# Patient Record
Sex: Female | Born: 1945 | Race: White | Hispanic: No | Smoking: Former smoker
Health system: Southern US, Community
[De-identification: ages and names within clinical notes are randomized; demographics above are authoritative.]

## PROBLEM LIST (undated history)

## (undated) DIAGNOSIS — E7800 Pure hypercholesterolemia, unspecified: Secondary | ICD-10-CM

## (undated) DIAGNOSIS — K589 Irritable bowel syndrome without diarrhea: Secondary | ICD-10-CM

## (undated) DIAGNOSIS — I4891 Unspecified atrial fibrillation: Secondary | ICD-10-CM

## (undated) DIAGNOSIS — J449 Chronic obstructive pulmonary disease, unspecified: Secondary | ICD-10-CM

## (undated) DIAGNOSIS — E785 Hyperlipidemia, unspecified: Secondary | ICD-10-CM

## (undated) DIAGNOSIS — M1990 Unspecified osteoarthritis, unspecified site: Secondary | ICD-10-CM

## (undated) DIAGNOSIS — E079 Disorder of thyroid, unspecified: Secondary | ICD-10-CM

## (undated) DIAGNOSIS — K52832 Lymphocytic colitis: Secondary | ICD-10-CM

## (undated) DIAGNOSIS — J45909 Unspecified asthma, uncomplicated: Secondary | ICD-10-CM

## (undated) DIAGNOSIS — N289 Disorder of kidney and ureter, unspecified: Secondary | ICD-10-CM

## (undated) DIAGNOSIS — A0472 Enterocolitis due to Clostridium difficile, not specified as recurrent: Secondary | ICD-10-CM

## (undated) DIAGNOSIS — R011 Cardiac murmur, unspecified: Secondary | ICD-10-CM

## (undated) HISTORY — DX: Chronic obstructive pulmonary disease, unspecified: J44.9

## (undated) HISTORY — DX: Unspecified osteoarthritis, unspecified site: M19.90

## (undated) HISTORY — DX: Enterocolitis due to Clostridium difficile, not specified as recurrent: A04.72

## (undated) HISTORY — PX: TONSILLECTOMY: SUR1361

## (undated) HISTORY — DX: Lymphocytic colitis: K52.832

## (undated) HISTORY — DX: Irritable bowel syndrome, unspecified: K58.9

## (undated) HISTORY — DX: Pure hypercholesterolemia, unspecified: E78.00

## (undated) HISTORY — DX: Unspecified asthma, uncomplicated: J45.909

## (undated) HISTORY — DX: Unspecified atrial fibrillation: I48.91

---

## 1998-05-02 ENCOUNTER — Other Ambulatory Visit: Admission: RE | Admit: 1998-05-02 | Discharge: 1998-05-02 | Payer: Self-pay | Admitting: Obstetrics and Gynecology

## 1998-05-22 ENCOUNTER — Other Ambulatory Visit: Admission: RE | Admit: 1998-05-22 | Discharge: 1998-05-22 | Payer: Self-pay | Admitting: Obstetrics and Gynecology

## 1998-09-03 ENCOUNTER — Other Ambulatory Visit: Admission: RE | Admit: 1998-09-03 | Discharge: 1998-09-03 | Payer: Self-pay | Admitting: Obstetrics and Gynecology

## 1999-02-03 ENCOUNTER — Other Ambulatory Visit: Admission: RE | Admit: 1999-02-03 | Discharge: 1999-02-03 | Payer: Self-pay | Admitting: Obstetrics & Gynecology

## 1999-08-11 ENCOUNTER — Encounter: Admission: RE | Admit: 1999-08-11 | Discharge: 1999-08-11 | Payer: Self-pay | Admitting: Pulmonary Disease

## 1999-08-11 ENCOUNTER — Encounter: Payer: Self-pay | Admitting: Pulmonary Disease

## 2000-01-26 ENCOUNTER — Encounter: Payer: Self-pay | Admitting: Obstetrics & Gynecology

## 2000-01-27 ENCOUNTER — Ambulatory Visit (HOSPITAL_COMMUNITY): Admission: RE | Admit: 2000-01-27 | Discharge: 2000-01-27 | Payer: Self-pay | Admitting: Obstetrics & Gynecology

## 2000-01-27 ENCOUNTER — Encounter (INDEPENDENT_AMBULATORY_CARE_PROVIDER_SITE_OTHER): Payer: Self-pay

## 2000-02-04 ENCOUNTER — Other Ambulatory Visit: Admission: RE | Admit: 2000-02-04 | Discharge: 2000-02-04 | Payer: Self-pay | Admitting: Obstetrics & Gynecology

## 2000-05-23 ENCOUNTER — Other Ambulatory Visit: Admission: RE | Admit: 2000-05-23 | Discharge: 2000-05-23 | Payer: Self-pay | Admitting: Obstetrics and Gynecology

## 2000-05-23 ENCOUNTER — Encounter (INDEPENDENT_AMBULATORY_CARE_PROVIDER_SITE_OTHER): Payer: Self-pay

## 2000-06-08 ENCOUNTER — Emergency Department (HOSPITAL_COMMUNITY): Admission: EM | Admit: 2000-06-08 | Discharge: 2000-06-08 | Payer: Self-pay | Admitting: *Deleted

## 2001-05-30 ENCOUNTER — Other Ambulatory Visit: Admission: RE | Admit: 2001-05-30 | Discharge: 2001-05-30 | Payer: Self-pay | Admitting: Obstetrics and Gynecology

## 2002-07-11 ENCOUNTER — Other Ambulatory Visit: Admission: RE | Admit: 2002-07-11 | Discharge: 2002-07-11 | Payer: Self-pay | Admitting: Obstetrics and Gynecology

## 2002-11-04 ENCOUNTER — Encounter: Admission: RE | Admit: 2002-11-04 | Discharge: 2002-11-04 | Payer: Self-pay | Admitting: Orthopaedic Surgery

## 2002-11-04 ENCOUNTER — Encounter: Payer: Self-pay | Admitting: Orthopaedic Surgery

## 2003-09-13 ENCOUNTER — Ambulatory Visit (HOSPITAL_COMMUNITY): Admission: RE | Admit: 2003-09-13 | Discharge: 2003-09-13 | Payer: Self-pay | Admitting: *Deleted

## 2004-07-10 ENCOUNTER — Other Ambulatory Visit: Admission: RE | Admit: 2004-07-10 | Discharge: 2004-07-10 | Payer: Self-pay | Admitting: *Deleted

## 2005-07-19 ENCOUNTER — Other Ambulatory Visit: Admission: RE | Admit: 2005-07-19 | Discharge: 2005-07-19 | Payer: Self-pay | Admitting: Obstetrics and Gynecology

## 2005-07-22 ENCOUNTER — Emergency Department (HOSPITAL_COMMUNITY): Admission: EM | Admit: 2005-07-22 | Discharge: 2005-07-22 | Payer: Self-pay | Admitting: Emergency Medicine

## 2005-08-11 ENCOUNTER — Ambulatory Visit: Admission: RE | Admit: 2005-08-11 | Discharge: 2005-08-11 | Payer: Self-pay | Admitting: Gynecologic Oncology

## 2005-08-17 ENCOUNTER — Encounter (INDEPENDENT_AMBULATORY_CARE_PROVIDER_SITE_OTHER): Payer: Self-pay | Admitting: *Deleted

## 2005-08-17 ENCOUNTER — Ambulatory Visit (HOSPITAL_COMMUNITY): Admission: RE | Admit: 2005-08-17 | Discharge: 2005-08-17 | Payer: Self-pay | Admitting: Gynecologic Oncology

## 2005-08-31 ENCOUNTER — Ambulatory Visit: Admission: RE | Admit: 2005-08-31 | Discharge: 2005-08-31 | Payer: Self-pay | Admitting: Obstetrics and Gynecology

## 2005-09-28 ENCOUNTER — Ambulatory Visit: Admission: RE | Admit: 2005-09-28 | Discharge: 2005-09-28 | Payer: Self-pay | Admitting: Gynecologic Oncology

## 2006-01-04 ENCOUNTER — Encounter (INDEPENDENT_AMBULATORY_CARE_PROVIDER_SITE_OTHER): Payer: Self-pay | Admitting: *Deleted

## 2006-01-04 ENCOUNTER — Other Ambulatory Visit: Admission: RE | Admit: 2006-01-04 | Discharge: 2006-01-04 | Payer: Self-pay | Admitting: Gynecologic Oncology

## 2006-01-04 ENCOUNTER — Ambulatory Visit: Admission: RE | Admit: 2006-01-04 | Discharge: 2006-01-04 | Payer: Self-pay | Admitting: Gynecologic Oncology

## 2006-07-27 ENCOUNTER — Other Ambulatory Visit: Admission: RE | Admit: 2006-07-27 | Discharge: 2006-07-27 | Payer: Self-pay | Admitting: Obstetrics & Gynecology

## 2006-09-30 ENCOUNTER — Ambulatory Visit: Payer: Self-pay | Admitting: Internal Medicine

## 2007-01-17 ENCOUNTER — Encounter (INDEPENDENT_AMBULATORY_CARE_PROVIDER_SITE_OTHER): Payer: Self-pay | Admitting: *Deleted

## 2007-01-17 ENCOUNTER — Other Ambulatory Visit: Admission: RE | Admit: 2007-01-17 | Discharge: 2007-01-17 | Payer: Self-pay | Admitting: Gynecologic Oncology

## 2007-01-17 ENCOUNTER — Ambulatory Visit: Admission: RE | Admit: 2007-01-17 | Discharge: 2007-01-17 | Payer: Self-pay | Admitting: Gynecologic Oncology

## 2007-08-04 ENCOUNTER — Other Ambulatory Visit: Admission: RE | Admit: 2007-08-04 | Discharge: 2007-08-04 | Payer: Self-pay | Admitting: Obstetrics & Gynecology

## 2007-08-10 ENCOUNTER — Other Ambulatory Visit: Admission: RE | Admit: 2007-08-10 | Discharge: 2007-08-10 | Payer: Self-pay | Admitting: Obstetrics & Gynecology

## 2007-11-06 ENCOUNTER — Ambulatory Visit: Payer: Self-pay | Admitting: Surgery

## 2007-12-04 ENCOUNTER — Encounter: Admission: RE | Admit: 2007-12-04 | Discharge: 2007-12-04 | Payer: Self-pay | Admitting: Surgery

## 2007-12-04 ENCOUNTER — Ambulatory Visit: Payer: Self-pay | Admitting: Surgery

## 2008-08-08 ENCOUNTER — Other Ambulatory Visit: Admission: RE | Admit: 2008-08-08 | Discharge: 2008-08-08 | Payer: Self-pay | Admitting: Obstetrics & Gynecology

## 2008-08-09 ENCOUNTER — Ambulatory Visit: Payer: Self-pay | Admitting: Emergency Medicine

## 2008-08-09 DIAGNOSIS — E785 Hyperlipidemia, unspecified: Secondary | ICD-10-CM

## 2008-08-09 DIAGNOSIS — R0602 Shortness of breath: Secondary | ICD-10-CM

## 2008-08-28 ENCOUNTER — Ambulatory Visit: Payer: Self-pay | Admitting: Emergency Medicine

## 2008-11-08 ENCOUNTER — Ambulatory Visit: Payer: Self-pay | Admitting: Emergency Medicine

## 2008-11-14 ENCOUNTER — Encounter: Payer: Self-pay | Admitting: Emergency Medicine

## 2008-12-05 ENCOUNTER — Ambulatory Visit: Payer: Self-pay | Admitting: Emergency Medicine

## 2008-12-05 DIAGNOSIS — R05 Cough: Secondary | ICD-10-CM

## 2008-12-05 DIAGNOSIS — R053 Chronic cough: Secondary | ICD-10-CM | POA: Insufficient documentation

## 2008-12-11 ENCOUNTER — Telehealth: Payer: Self-pay | Admitting: Emergency Medicine

## 2010-02-10 ENCOUNTER — Emergency Department (HOSPITAL_BASED_OUTPATIENT_CLINIC_OR_DEPARTMENT_OTHER): Admission: EM | Admit: 2010-02-10 | Discharge: 2010-02-11 | Payer: Self-pay | Admitting: Emergency Medicine

## 2010-02-10 ENCOUNTER — Ambulatory Visit: Payer: Self-pay | Admitting: Diagnostic Radiology

## 2010-02-11 ENCOUNTER — Ambulatory Visit: Payer: Self-pay | Admitting: Diagnostic Radiology

## 2010-05-20 ENCOUNTER — Encounter: Payer: Self-pay | Admitting: Emergency Medicine

## 2010-09-10 ENCOUNTER — Telehealth (INDEPENDENT_AMBULATORY_CARE_PROVIDER_SITE_OTHER): Payer: Self-pay | Admitting: *Deleted

## 2010-09-11 ENCOUNTER — Ambulatory Visit: Payer: Self-pay | Admitting: Emergency Medicine

## 2010-09-14 ENCOUNTER — Encounter: Payer: Self-pay | Admitting: Emergency Medicine

## 2010-09-21 ENCOUNTER — Ambulatory Visit: Payer: Self-pay | Admitting: Emergency Medicine

## 2010-10-01 ENCOUNTER — Telehealth: Payer: Self-pay | Admitting: Emergency Medicine

## 2010-10-05 ENCOUNTER — Ambulatory Visit: Payer: Self-pay | Admitting: Emergency Medicine

## 2010-10-05 ENCOUNTER — Encounter
Admission: RE | Admit: 2010-10-05 | Discharge: 2010-10-20 | Payer: Self-pay | Source: Home / Self Care | Attending: Orthopaedic Surgery | Admitting: Orthopaedic Surgery

## 2010-11-11 ENCOUNTER — Telehealth (INDEPENDENT_AMBULATORY_CARE_PROVIDER_SITE_OTHER): Payer: Self-pay | Admitting: *Deleted

## 2010-11-14 ENCOUNTER — Encounter: Payer: Self-pay | Admitting: Orthopaedic Surgery

## 2010-11-15 ENCOUNTER — Encounter: Payer: Self-pay | Admitting: Surgery

## 2010-11-18 ENCOUNTER — Encounter: Payer: Self-pay | Admitting: Emergency Medicine

## 2010-11-18 ENCOUNTER — Ambulatory Visit (HOSPITAL_COMMUNITY)
Admission: RE | Admit: 2010-11-18 | Discharge: 2010-11-18 | Payer: Self-pay | Source: Home / Self Care | Attending: Emergency Medicine | Admitting: Emergency Medicine

## 2010-11-24 ENCOUNTER — Telehealth: Payer: Self-pay | Admitting: Emergency Medicine

## 2010-11-24 DIAGNOSIS — R0602 Shortness of breath: Secondary | ICD-10-CM

## 2010-11-25 ENCOUNTER — Telehealth: Payer: Self-pay | Admitting: Emergency Medicine

## 2010-11-26 NOTE — Progress Notes (Signed)
Summary: PFT results  Phone Note Call from Patient Call back at [phone]   Reason for Call: Talk to Nurse Summary of Call: Requesting results of pft. Initial call taken by: Lehman Prom,  October 01, 2010 8:39 AM  Follow-up for Phone Call        called spoke with patient to inform her that RB is seeing pts this morning so it may be a while before she is contacted with her pft results.  pt okay with this.    would also like to RB to know that she had to cancel her CPST d/t shoulder discomfort, but rescheduled this to after the first of the year. Follow-up by: Boone Master CNA/MA,  October 01, 2010 9:18 AM  Additional Follow-up for Phone Call Additional follow up Details #1::        Results given to RB to review Vernie Murders  October 01, 2010 10:17 AM  PFT's show some possible MILD obstruction, normal lung volumes. Probably either normal or cinsistent with some very mild asthma. Leslye Peer MD  October 02, 2010 6:13 PM   Additional Follow-up by: Leslye Peer MD,  October 02, 2010 6:13 PM    Additional Follow-up for Phone Call Additional follow up Details #2::    pt advised of results. Carron Curie CMA  October 05, 2010 9:06 AM

## 2010-11-26 NOTE — Progress Notes (Signed)
Summary: cough- appt sched  Phone Note Call from Patient Call back at [phone]   Caller: Patient Call For: byrum Summary of Call: have questions about copd and spiriva Initial call taken by: Rickard Patience,  September 10, 2010 11:52 AM  Follow-up for Phone Call        Called and spoke with pt.  She states that she has still been bothered with her cough- was started on spiriva per Dr Tenny Craw and this has made her cough worse.  She states that she had a cxr a few months ago and was told that she had COPD.  I advised best to come in for an ov.  I advised that she should obtain her cxr on disc and come in.  Pt agreed to this and sched her appt with RB for 09/11/10 at 10 am. Follow-up by: Vernie Murders,  September 10, 2010 1:20 PM

## 2010-11-26 NOTE — Letter (Signed)
Summary: [facility]  [address]   [place], [place] [address]   Phone: [phone]  Fax: [phone]     Patient's Name: [name] [name] Date of Birth: 1946-09-01 MRN: [id number]  CPST  Choose test method and choice  a)___Bike - recommended by ATS/ACCP. Do at [facility] at Dr. Gala [facility]  b)___Treadmill - less preferred. Do at [facility] at Dr. Gala Romney lab or do at [facility] PFT lab  Choose one or more indication for test  INDICATIONS FOR CARDIOPULMONARY EXERCISE TESTING Evaluation of exercise tolerance ______ Determination of functional impairment or capacity (peak V? O2) ______ Determination of exercise-limiting factors and pathophysiologic mechanisms  Evaluation of undiagnosed exercise intolerance _____ Assessing contribution of cardiac and pulmonary etiology in coexisting disease _____ Symptoms disproportionate to resting pulmonary and cardiac tests  _____Unexplained dyspnea when initial cardiopulmonary testing is nondiagnostic  Evaluation of patients with cardiovascular disease _____ Functional evaluation and prognosis in patients with heart failure _____ Selection for cardiac transplantation _____ Exercise prescription and monitoring response to exercise training for cardiac rehabilitation (special circumstances; i.e., pacemakers)  Evaluation of patients with respiratory disease _____ Functional impairment assessment (see specific clinical applications)  _____Chronic obstructive pulmonary disease Establishing exercise limitation(s) and assessing other potential contributing factors, especially occult heart disease (ischemia) ______Determination of magnitude of hypoxemia and for O2 prescription When objective determination of therapeutic intervention is necessary and not adequately addressed by standard pulmonary function testing  _____ Interstitial lung diseases _____Detection of early (occult) gas exchange abnormalities _____Overall  assessment/monitoring of pulmonary gas exchange _____Determination of magnitude of hypoxemia and for O2 prescription _____Determination of potential exercise-limiting factors _____Documentation of therapeutic response to potentially toxic therapy  ____ Pulmonary vascular disease (careful risk-benefit analysis required)  ____ Cystic fibrosis  ____ Exercise-induced bronchospasm  Specific clinical applications ____  Preoperative evaluation _____Lung resectional surgery _____Elderly patients undergoing major abdominal surgery _____Lung volume resectional surgery for emphysema (currently investigational)  ____ Exercise evaluation and prescription for pulmonary rehabilitation  ____ Evaluation for impairment-disability  ____ Evaluation for lung, heart-lung transplantation ____ Definition of abbreviation: V? O2______ -oxygen consumption.    [facility]    [employer] [facility]

## 2010-11-26 NOTE — Letter (Signed)
Summary: CPST- R/O Contraindications  [facility]  [address]   [place], [place] [address]   Phone: [phone]  Fax: [phone]    Patient's Name: [name] [name] Date of Birth: September 23, 1946 MRN: [id number]  *********Rule out Contraindications**************** Absolute                                                                                                                           ___ Acute MI (3-5 Days)                                 ___ Unstable Angina                                          ___ Uncontrolled arrhythmias causing symptoms or hemodynamic compromise. ___ Syncope                                                     ___ Active endocarditis                                         ___ Acute Myocarditis/Pericarditis                        ___ Symptomatic severe aortic stenosis  ___ Acute Pulmonary embolus or pulmonary infarction                ___ Uncontrolled Heart Failure  ___ Thrombosis of lower extremitie ___ Suspected dissecting aneurysm ___ Uncontrolled Asthma                          ___ Pulmonary Edema                                        ___ RA desat @ rest<85%                                      ___ Repiratory Failure                                         ___ Acute noncardiopulmonary disorder that may affect exercise performance or be         aggravated by exercise (ie infection, renal failure,  thyrotoxicosis) .                               ___ Mental impairment leading to inabliity to cooperate   Relative ___ Left main coronary stenosis or its equivalent ___ Moderate stentoic valvular heart disease ___ Severe untreated arterial hypertension @ rest (<200 mmHg             systolic,>186mmHg Diastolic ___ Tachy/Brady Arrhythmias ___ High- degree artioventricular block ___ Hypertrophic cardiomyopathy ___ Significant pulmonary hypertension ___ Advanced or complicated pregnancy ___ Electrolyte abnormalities ___ Orthopedic impairment  that compromises exercise performance    [employer] [employer] [facility]

## 2010-11-26 NOTE — Assessment & Plan Note (Signed)
Summary: dyspnea   Visit Type:  Follow-up Copy to:  Dr Leda Quail Primary Provider/Referring Provider:  Dr Duane Lope  CC:  Acute Visit , pt has not been seen since Feb. 2010, c/o increase sob worse with exertion, also feels she has to clear her throat frequently, and was taking spiriva but stopped 2 weeks agos.  History of Present Illness: 65 yo woman with little PMH, never smoker. Began to notice back in 2004 that she was having lower exertional tolerance, SOB, L arm pain. Had an abnormal stress test, prompted cardiac cath in 2005 - no intervention. Repeat stress test more recently showed no ischemia (Dr Eldridge Dace). Stays active, but still unable to exert significantly. She has had a vascular eval as well, CT-PA showed patent LUE arterial tree but with decreased ulnar and radial filling (? significance). Segmental pressures normal.   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  Feb 11, 10 -- Returns for follow up and eval, has had TTE since last visit.  Began to have nasal congestion, dry cough with feeling of something in her throat, frequent clearing of the throat. Has tried proventil, but this did noy help the cough. Has tried pseudophed, benadryl with some relief. Nose is not stuffed, has drainage down the back of the throat. Continues to have exertional SOB with stairs, other exercise.   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 Tells me that in May '11 she had a bad URI - cough, body aches. Led to some breathing difficulty. She performed Spirometry, a CXR, was started on Spiriva.  The CXR showed borderline large lung volumes. Stopped the Spiriva 2 weeks ago due to hoarse voice. She continues to have SOB with exertion, gets SOB after walking over 100 ft. Able to play golf. The Spiriva hasn't helped her breathing in any way. She also has tried Ventolin, no change. Formerly went to [employer], not  currently. Last TSH was in April, was OK.   Preventive Screening-Counseling & Management  Alcohol-Tobacco     Smoking Status: never  Current Medications (verified): 1)  Synthroid 100 Mcg Tabs (Levothyroxine Sodium) .... Take 1 Tablet By Mouth Once A Day 2)  Zoloft 50 Mg Tabs (Sertraline Hcl) .... Take 1 Tablet By Mouth Once A Day 3)  Tylenol Pm Extra Strength 500-25 Mg Tabs (Diphenhydramine-Apap (Sleep)) .... Take 1 Tab By Mouth At Bedtime As Needed 4)  Proventil Hfa 108 (90 Base) Mcg/act  Aers (Albuterol Sulfate) .Marland Kitchen.. 1-2 Puffs Every 4-6 Hours As Needed 5)  Aerochamber Plus  Misc (Spacer/aero-Holding Chambers) .... Use With Proventil Hfa Inhaler Inhale 2 Puffs Every 4 Hours As Needed 6)  Lipitor 10 Mg Tabs (Atorvastatin Calcium) .... Once Daily 7)  Coq-10 100 Mg Caps (Coenzyme Q10) .Marland Kitchen.. 1 Once Daily  Allergies (verified): 1)  ! Sulfa 2)  ! * Shellfish  Vital Signs:  Patient profile:   65 year old female Height:      68 inches Weight:      177.4 pounds BMI:     27.07 O2 Sat:      97 % on Room air Temp:     98.3 degrees F oral Pulse rate:   81 / minute BP sitting:   112 / 68  (left arm)  Vitals Entered By: Renold Genta RCP, LPN (September 11, 2010 9:45 AM)  O2 Flow:  Room air CC: Acute Visit , pt has  not been seen since Feb. 2010, c/o increase sob worse with exertion, also feels she has to clear her throat frequently, was taking spiriva but stopped 2 weeks agos Is Patient Diabetic? No Comments Medications reviewed with patient Renold Genta RCP, LPN  September 11, 2010 9:45 AM    Physical Exam  General:  well developed, well nourished, in no acute distress Head:  normocephalic and atraumatic Nose:  no deformity, discharge, inflammation, or lesions Mouth:  no deformity or lesions Neck:  no masses, thyromegaly, or abnormal cervical nodes Lungs:  clear bilaterally to auscultation and percussion Heart:  regular rate and rhythm, S1, S2 without murmurs, rubs, gallops, or  clicks Abdomen:  not examined Msk:  no deformity or scoliosis noted with normal posture Extremities:  no clubbing, cyanosis, edema, or deformity noted Skin:  intact without lesions or rashes Psych:  alert and cooperative; normal mood and affect; normal attention span and concentration   X-ray  Procedure date:  02/25/2010  Findings:      Normal lungs, borderline large volumes, no evidence bullous disease  Impression & Recommendations:  Problem # 1:  DYSPNEA (ICD-786.05) I suspect that this reflects deconditioning given her past reassuring cardiac and pulm evals, but will extend the wporkup to look at exercise tolerance - full PFT - obtain spiro from [facility] - CPEX - stop Spiriva - ventolin as needed  - rov to reviuew the above  Medications Added to Medication List This Visit: 1)  Coq-10 100 Mg Caps (Coenzyme q10) .Marland Kitchen.. 1 once daily  Other Orders: Est. Patient Level IV (14782) Pulmonary Referral (Pulmonary)  Patient Instructions: 1)  We will obtain your sp[facility]rometry done at [facility] to review 2)  We will repeat your full pulmonary function testing to compare with priors.  3)  We will schedule a cardiopulmonary exercise test 4)  Stop Spiriva 5)  Keep Ventolin available to use as needed  6)  Flu shot today 7)  Follow up with Dr Delton Coombes after your testing to review and plan next steps.   Appended Document: dyspnea Flu Vaccine Consent Questions     Do you have a history of severe allergic reactions to this vaccine? no    Any prior history of allergic reactions to egg and/or gelatin? no    Do you have a sensitivity to the preservative Thimersol? no    Do you have a past history of Guillan-Barre Syndrome? no    Do you currently have an acute febrile illness? no    Have you ever had a severe reaction to latex? no    Vaccine information given and explained to patient? yes    Are you currently pregnant? no    Lot Number:[id number]   Exp Date:04/24/2011   Site Given  Left Deltoid  IM Armita [facility], LPN  September 11, 2010 12:56 PM           Clinical Lists Changes  Orders: Added new Service order of Flu Vaccine 6yrs + MEDICARE PATIENTS ([id number]) - Signed Added new Service order of Administration Flu vaccine - MCR (H0865) - Signed Observations: Added new observation of FLU VAX VIS: 05/19/2010 version (09/11/2010 12:55) Added new observation of FLU VAXLOT: [id number] (09/11/2010 12:55) Added new observation of FLU VAXMFR: [employer] (09/11/2010 12:55) Added new observation of FLU VAX EXP: 04/24/2011 (09/11/2010 12:55) Added new observation of FLU VAX DSE: 0.41ml (09/11/2010 12:55) Added new observation of FLU VAX: Fluvax 3+ (09/11/2010 12:55)

## 2010-11-26 NOTE — Progress Notes (Signed)
Summary: Cardiopulm exercise test  Phone Note Call from Patient Call back at Home Phone [phone]   Caller: Patient Call For: byrum Reason for Call: Talk to Nurse Summary of Call: Patient was scheduled for a cardiopulm exercise test in November, but patient cancelled appointment due to personal reasons.  Patient is calling today saying she would like to do this now, she would like to be scheduled for test. Initial call taken by: Lehman Prom,  November 11, 2010 2:18 PM  Follow-up for Phone Call        cpst rescheduled to 11/18/10@1 :00pm pt is aware of this appt  Follow-up by: Oneita Jolly,  November 11, 2010 4:11 PM

## 2010-11-26 NOTE — Miscellaneous (Signed)
Summary: Orders Update pft charges  Clinical Lists Changes  Orders: Added new Service order of Carbon Monoxide diffusing w/capacity (94720) - Signed Added new Service order of Lung Volumes (94240) - Signed Added new Service order of Spirometry (Pre & Post) (94060) - Signed 

## 2010-12-02 NOTE — Progress Notes (Signed)
Summary: results  Phone Note Call from Patient Call back at Home Phone [phone]   Caller: Patient Call For: DR [id number] Summary of Call: Patient phoned stated that she had pulmonary cardiac function test at [facility] last Wednesday and she wants the results. She can be reached at [phone] Initial call taken by: Vedia Coffer,  November 24, 2010 11:13 AM  Follow-up for Phone Call        Spoke with pt and advised that RB out of the office until 11/27/10 and so will have to wait until then for results. Pt verbalized understanding and is okay with waiting. Pls advise CPST results, thanks! Follow-up by: Vernie Murders,  November 24, 2010 12:54 PM  Additional Follow-up for Phone Call Additional follow up Details #1::        CPEX shows probably normal study. Some subtle suspicion for possible diastolic dysfxn, but this was not seen on her TTE in 10/2008. Likewise, I doubt methacholine spiro will reveal anything more than we already know about her mild AFL. Effort, functional capacity, HR, BP responses all normal. Tried to call her to review but she was unavailable. Will call her back. Leslye Peer MD  November 25, 2010 2:15 PM  Additional Follow-up by: Leslye Peer MD,  November 25, 2010 2:15 PM    Additional Follow-up for Phone Call Additional follow up Details #2::    I read it today. Please let patients know that turnaround time is 2-3 weeks for this test. Anyways, it should pop up on Dr. Delton Coombes lookat tomorrow or so Follow-up by: Kalman Shan MD,  November 24, 2010 5:08 PM  Additional Follow-up for Phone Call Additional follow up Details #3:: Details for Additional Follow-up Action Taken: Discussed results w her, no evidence for exertional limitation. ? AFL on spiro but no response to BD trial (in fact worse). She is frustrated by these results, but I don' thave anything here to support cardiac or pulm limitation. i asked her if she believes she might need cardiopulm conditioning, but  she states she is "on the go all the time, doesn;t need to exercise more". She asked me if prednisone would help, but I don' thave any evidence that it would. Offered to see her again in office to see if we could uncover anything else, she will call if so.  Additional Follow-up by: Leslye Peer MD,  November 27, 2010 4:21 PM

## 2010-12-02 NOTE — Progress Notes (Signed)
Summary: returned call- pt called again  Phone Note Call from Patient Call back at Home Phone [phone]   Caller: Patient Call For: Tiki Tucciarone Summary of Call: pt returned call from dr Sidhant Helderman (says dr b called her today) Initial call taken by: Tivis Ringer, CNA,  November 25, 2010 4:30 PM  Follow-up for Phone Call        pt calling again. she asks to be called at [phone]. Tivis Ringer, CNA  November 27, 2010 12:10 PM  Pt called again .Darletta Moll  November 27, 2010 3:56 PM

## 2011-03-09 NOTE — Procedures (Signed)
UPPER EXTREMITY ARTERIAL EVALUATION-SEGMENTALS   INDICATION:  Left arm pain.  Patient complains of left upper arm pain  which radiates to shoulder, only with ambulation.  The pain is not  positional.   HISTORY:  Diabetes:  No.  Cardiac:  No.  Hypertension:  No.  Smoking:  No.  Previous Surgery:  No.   RESTING SYSTOLIC PRESSURES                                   RIGHT                LEFT  Brachial:                        118                  118  Radial:                          118                  118  Ulnar:                           119                  119  DOPPLER WAVEFORM ANALYSIS:  Brachial:                        Triphasic            Triphasic  Radial:                          Biphasic/triphasic   Biphasic/triphasic  Ulnar:                           Triphasic            Triphasic  Superficial palmar arch:   IMPRESSION:  1. No evidence of upper extremity arterial disease bilaterally.  2. No significant arm pressure difference change noted pre to post      exercise.   ___________________________________________  V. Charlena Cross, MD   PB/MEDQ  D:  11/06/2007  T:  11/07/2007  Job:  [id number]

## 2011-03-09 NOTE — Assessment & Plan Note (Signed)
OFFICE VISIT   [name] [name], [name] [name]  DOB:  03/30/1946                                       11/06/2007  ZOXWR#:[id number]   HISTORY:  This is a 65 year old female I am seeing at the request of Dr.  Tenny Craw for pain in her left hand with exercise.  The patient states that  she has been having these symptoms for approximately 4 years.  She  states that, with exercising, she gets out of breath and then suffers  from throbbing in her left arm.  This has progressed and has gotten to  where she has problem with this at approximately 1 block.  She states  that it is brought on by exercise.  There are no relieving factors.  She  denies having swelling in the arm.   The patient has undergone extensive workup to date.  This includes a  cardiac catheterization as well as multiple stress echocardiograms.  All  of the reports are negative.  She comes in today for further vascular  workup.   REVIEW OF SYSTEMS:  GENERAL:  Weight is 170 pounds.  CARDIAC:  Arm and chest pain.  Positive for shortness of breath.  PULMONARY:  Negative.  GASTROINTESTINAL:  Negative.  GENITOURINARY:  Negative.  VASCULAR:  As above.  NEURO:  Nitroglycerin.  ORTHO:  Positive for arthritis, joint pain, and muscle pain.  PSYCH:  Negative.  ENT:  Negative.  HEMATOLOGICAL:  Negative.   PAST MEDICAL HISTORY:  Hypercholesterolemia.   PAST SURGICAL HISTORY:  Cardiac catheterization.   FAMILY HISTORY:  Negative for cardiovascular disease.   SOCIAL HISTORY:  She is married with 2 children.  She does not smoke.  She has a history of smoking, but quite in 1963.  She drinks wine on a  daily basis.   MEDICATIONS:  Lipitor 20 mg per day.  Sertraline 50 mg per day.  Synthroid 125 mcg per day.  Advil p.r.n.  Fish oil daily.  Multivitamins.   ALLERGIES:  Sulfa.   PHYSICAL EXAMINATION:  Vital signs:  Heart rate is 71, blood pressure  118/68.  In general, she is well-appearing, in no acute distress.  HEENT, normocephalic, atraumatic.  Pupils equal.  Neck is supple without  mass.  Cardiovascular is regular rate and rhythm.  Respirations  nonlabored.  Psych, she is alert and oriented.  Neuro, cranial nerves 2-  12 are intact grossly intact.  Left arm has a palpable radial pulse.  There is no swelling.  I placed the patient in 45 degrees as well as 90  degrees abduction, and she never loses her radial pulse.  She was not  symptomatic with this.   DIAGNOSTIC STUDIES:  The patient had vascular lab exam today.  This  reveals no evidence of upper extremity arterial disease bilaterally.  I  also had the patient do exercise test and, when she became symptomatic.  Duplex reveals no significant arm pressure difference between exercise  and rest.   ASSESSMENT AND PLAN:  Left arm pain with exercise.   PLAN:  Based on the patient's resting and exercise testing, which  brought about symptoms, she does not demonstrate any evidence of  arterial insufficiency.  Based on these findings, I do not feel like  further diagnostic workup is going to reveal any pathology.  However, I  think it is reasonable to proceed with  a CT angiogram of the chest and  left arm to better image the arterial system as well as to look for any  soft tissue mass that could be contributing to her discomfort.  I  reiterated to the patient that I think these studies will most likely be  negative.  However, this would be more thorough in looking for the  etiology for this.  Should this test come back negative, I think it  would be safe to say that there is not a vascular reason for her having  these symptoms.  The patient is going to follow up with me in 2 weeks  following her scan.   Jorge Ny, MD  Electronically Signed   VWB/MEDQ  D:  11/06/2007  T:  11/07/2007  Job:  [id number]   cc:   C. Duane Lope, M.D.

## 2011-03-09 NOTE — Assessment & Plan Note (Signed)
OFFICE VISIT   [name], [name]  DOB:  12-23-1945                                       12/04/2007  UJWJX#:[id number]   REASON FOR VISIT:  Followup.   HISTORY:  This is a 65 year old female that I initially saw who was  having pain in her left hand with activity.  This was also associated  with shortness of breath.  These symptoms have been going on for 4  years.  She had obtained a vascular lab exam which was done at rest as  well as with exercise which produced symptoms and she did not have any  evidence on vascular ultrasound of arterial insufficiency.  To be  thorough, to rule out an arterial or venous cause, I had scheduled her  to have a CT scan of her chest and left arm to rule out any kind of soft  tissue compression or aneurysm that could potentially be causing her  problems.  She comes back in today for further discussions of this.   PHYSICAL EXAMINATION:  Vital signs:  Blood pressure of 109/67, pulse 77.  General:  This is a well-appearing patient in no acute distress.  Extremities:  Her left arm is warm and well perfused.   DIAGNOSTIC STUDIES:  CT scan was reviewed today.  There does not appear  to be any arterial pathology identified.  There is no evidence for  etiology of her symptoms.   ASSESSMENT AND PLAN:  I spoke extensively with the patient stating that  I did not see any vascular etiology for the discomfort she is having.  This could be of neurologic etiology, however, I am not the best person  to evaluate this.  I told her that I could definitively say that there  was no arterial etiology for her symptoms.  She will follow up on a  p.r.n. basis.   Jorge Ny, MD  Electronically Signed   VWB/MEDQ  D:  12/04/2007  T:  12/06/2007  Job:  [id number]   cc:   C. Duane Lope, M.D.

## 2011-03-12 NOTE — Op Note (Signed)
[name] [name], [name] [name]            ACCOUNT NO.:  [id number]   MEDICAL RECORD NO.:  [id number]          PATIENT TYPE:  OUT   LOCATION:  GYN                          FACILITY:  [facility]   PHYSICIAN:  John T. Kyla Balzarine, M.D.    DATE OF BIRTH:  [birth year]   DATE OF PROCEDURE:  01/04/2006  DATE OF DISCHARGE:                                 OPERATIVE REPORT   CHIEF COMPLAINT:  Follow-up with VIN III.   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 She had a superficial partial separation in the  perianal region which has completely healed. She complains of discomfort  during intercourse and vaginal dryness.   PAST MEDICAL HISTORY:  No major comorbidities other than chronic  hypothyroidism. She is status post TNA, cryosurgery and NSVD x2 in addition  to vulvar surgery.   MEDICATIONS:  Synthroid, multivitamins and Zoloft   ALLERGIES:  Shellfish allergy.   FAMILY HISTORY:  Denies significant gynecologic or breast cancers.   PERSONAL AND SOCIAL HISTORY:  Married, admits daily wine but denies tobacco  or recreational drug use.   REVIEW OF SYSTEMS:  Other than noted above, completely negative review of  systems in comprehensive 10/10 systems.   PHYSICAL EXAMINATION:  VITAL SIGNS:  Her weight 173 pounds, blood pressure  120/62. The patient is afebrile with respirations 18.  GENERAL:  The patient is alert and oriented x3 in no acute distress.  ABDOMEN:  The abdomen is soft and benign with no ascites, mass, tenderness  or organomegaly. There is no back or CVA tenderness. LYMPH NODE:  Survey  negative for pathologic lymphadenopathy.  EXTREMITIES:  Full strength and range of motion.  PELVIC:  External genitalia and BUS are normal to inspection and palpation,  with healed posterior vulvar incision site and no suspicious lesions or  granulation tissue. Vagina is  atrophic as is cervix, without lesions.  Bimanual and rectovaginal examinations reveal normal uterus with no palpable  adnexal pathology.   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   PLAN:  The patient is given a prescription for vaginal estrogen and will  also supplement with Astroglide on a p.r.n. basis during intercourse.  Cytology obtained will be communicated to the patient. We can begin  alternating follow-up at 81-month intervals with Dr. Tresa Res as she generally  has her annual pelvic examination in the autumn.      John T. Kyla Balzarine, M.D.  Electronically Signed     JTS/MEDQ  D:  01/04/2006  T:  01/05/2006  Job:  [id number]   cc:   Edwena Felty. Romine, M.D.  Fax: [phone]   Andres Ege, M.D.  Fax: [phone]   Telford Nab, R.N.  [address]  [place], [place] [address]

## 2011-03-12 NOTE — Consult Note (Signed)
[name] [name], [name] [name]            ACCOUNT NO.:  [id number]   MEDICAL RECORD NO.:  [id number]          PATIENT TYPE:  OUT   LOCATION:  GYN                          FACILITY:  [facility]   PHYSICIAN:  John T. Kyla Balzarine, M.D.    DATE OF BIRTH:  [birth year]   DATE OF CONSULTATION:  08/31/2005  DATE OF DISCHARGE:                                   CONSULTATION   CHIEF COMPLAINT:  Follow-up of VIN-3 and superficial wound separation.   HISTORY OF PRESENT ILLNESS:  The patient underwent wide excision of a  hyperkeratotic lesion encompassing essentially the entire posterior  forchette on August 17, 2005. Final pathology revealed high-grade vulvar  intraepithelial neoplasia. Margins were positive. She has had a superficial  partial separation in the perianal region and complains of local discomfort.  She is having bowel movements and has returned to work.   PAST MEDICAL AND SURGICAL HISTORY:  Unchanged from those recorded on August 11, 2005.   ALLERGIES:  SHELLFISH allergy.   MEDICATIONS:  Synthroid, multivitamins and Zoloft.   FAMILY HISTORY AND PERSONAL SOCIAL HISTORY:  Unchanged from those recorded  August 01, 2005.   REVIEW OF SYSTEMS:  Otherwise comprehensive 10-point review is negative.   EXAM:  Weight 170 pounds and vital signs stable as recorded in the clinic  flow sheet. The operative site is inspected and the suture line is intact  without erythema other than a 2cm x 2 mm skin separation at 6 o'clock  extending to the anterior anus. Bimanual and rectovaginal examinations were  not performed.   ASSESSMENT:  1.  Small suture line separation.  2.  VIN-3 with microscopic involvement of margins.   PLAN:  The patient will continue her rinse/blot/blow dry regimen and sitz  baths as needed. She was encouraged in regards to wound healing to date. I  suggest the use Anusol-HC ointment. We will see her back for follow-up when  she has completed her 6-week convalescence.   In regards to  her VIN, her risk is marginally increased by virtue of  positive margins. Long term, I would recommend alternating follow-up  inspection of the vulva at six-month intervals, which could be performed in  conjunction with Dr. Laurena Bering or Dr. Tresa Res.      John T. Kyla Balzarine, M.D.  Electronically Signed     JTS/MEDQ  D:  08/31/2005  T:  09/01/2005  Job:  [id number]   cc:   Telford Nab, R.N.  [address]  [place], [place] [address]   Edwena Felty. Romine, M.D.  Fax: [phone]   Andres Ege, M.D.  Fax: [phone]

## 2011-03-12 NOTE — Consult Note (Signed)
[name] [name], [name] [name]            ACCOUNT NO.:  [id number]   MEDICAL RECORD NO.:  [id number]          PATIENT TYPE:  OUT   LOCATION:  GYN                          FACILITY:  [facility]   PHYSICIAN:  John T. Kyla Balzarine, M.D.    DATE OF BIRTH:  1946/09/01   DATE OF CONSULTATION:  08/11/2005  DATE OF DISCHARGE:                                   CONSULTATION   HISTORY:  This 65 year old, para 2 woman is seen at the request of Dr.  Katherine Mantle for recommendations regarding management of high-grade VIN.   HISTORY OF PRESENT ILLNESS:  The patient has a remote history of cervical  dysplasia treated with cryotherapy approximately 20 years ago and states  normal cytology since.  She was recently married and, on a routine physical  examination, was found to have white epithelium involving the perineum.  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  The patient  relates no symptoms of pruritus or pain and denies bleeding.  Cervical  cytology was apparently normal.   PAST MEDICAL HISTORY:  No major comorbidities other than chronic  hypothyroidism.   PAST SURGICAL HISTORY:  Prior T&A, cryosurgery and NSVD x 2.   MEDICATIONS:  1.  Synthroid 0.75 mg daily with 1/2 tablet twice weekly.  2.  Multivitamins.  3.  Zoloft 100 mg q.a.m.   ALLERGIES:  The patient has a shellfish allergy and recently had an acute  reaction to crab.   FAMILY HISTORY:  Denies significant gynecologic malignancy or breast cancer.   PERSONAL AND SOCIAL HISTORY:  Married.  Admits daily wine.  Denies tobacco  or recreational drug use.   REVIEW OF SYSTEMS:  The patient has mild depression/anxiety without suicidal  ideation.  Otherwise, comprehensive 10-point review of systems is negative.   PHYSICAL EXAMINATION:  VITAL SIGNS:  Weight 170 pounds.  Blood pressure  118/74, pulse 84, respirations 18.  GENERAL APPEARANCE:  The patient is anxious, alert and oriented x 3 in no  acute distress.  LYMPHATIC:  There is no  pathologic lymphadenopathy on lymph node survey.  ENT:  Benign with clear oropharynx.  NECK:  Supple neck and no goiter.  LUNGS:  Fields clear.  HEART:  Regular rhythm with no JVD and intact peripheral pulses.  BACK:  Nontender with no CVA tenderness.  ABDOMEN:  Soft and benign without ascites, mass, tenderness, hernia or  organomegaly.  EXTREMITIES:  Full strength and range of motion throughout with intact  peripheral pulses and sensation.  No edema.  PELVIC:  External genitalia and BUS are normal to inspection and palpation  with the exception of a 4 x 2-cm patch of erythematous epithelium at the  posterior fourchette, extending from the left inner labia minora across to  the junction of the right inner labia minora.  Inspection with acetic acid  reveals this to be acetowhite with a condylomatous-type appearance.  Healing  biopsy sites are present.  There is no thickening below the epithelium.  Anus and urethra are widely clear.  Hemorrhoidal tags are present.  The  vagina is clear without mucosal lesions.  Cervix is mobile with normal  consistency and no gross lesions.  Bimanual and rectovaginal examinations  disclose normal uterus with no adnexal or parametrial mass/nodularity.   ASSESSMENT:  Vulvar intraepithelial neoplasm III.   PLAN:  I had a long discussion with the patient regarding risks and benefits  of laser vaporization versus wide local excision.  Because of the size of  the lesion, I believe it would be best to perform wide local excision to  obtain histology and exclude an invasive lesion.  Questions were answered,  and surgery will be scheduled for October 24th 2006 with Dr Cleda Mccreedy.      John T. Kyla Balzarine, M.D.  Electronically Signed     JTS/MEDQ  D:  08/11/2005  T:  08/11/2005  Job:  [id number]   cc:   Andres Ege, M.D.  Fax: [phone]   Edwena Felty. Romine, M.D.  Fax: [phone]   Telford Nab, R.N.  [address]  [place], [place] [address]

## 2011-03-12 NOTE — Cardiovascular Report (Signed)
NAME:  DESIREA, MIZRAHI                      ACCOUNT NO.:  [id number]   MEDICAL RECORD NO.:  [id number]                   PATIENT TYPE:  OIB   LOCATION:  2860                                 FACILITY:  [facility]   PHYSICIAN:  Carole Binning, M.D. [facility]         DATE OF BIRTH:  [birth year]   DATE OF PROCEDURE:  09/13/2003  DATE OF DISCHARGE:                              CARDIAC CATHETERIZATION   PROCEDURE PERFORMED:  Left heart catheterization with coronary angiography  and left ventriculography.   INDICATION:  Ms. Laural Benes is a 65 year old woman who has been experiencing  exertional left arm pain.  A recent treadmill EKG test performed in the office was notable for  reproduction of her symptoms and development of ST segment depression with  exercise.  We therefore opted to proceed with cardiac catheterization to  rule coronary artery disease.   PROCEDURAL NOTE:  A 6 French sheath was placed in the right femoral artery.  Coronary angiography was performed with 6 [place] JL-4 and JR-4 catheters.  Left ventriculography was performed with an angled pigtail catheter.  Contrast was Omnipaque.  There were no complications.   RESULTS:   HEMODYNAMICS:  1. Left ventricular pressure 136/14.  2. Aortic pressure 136/66.  3. There is no aortic valve gradient.   LEFT VENTRICULOGRAM:  Wall motion is normal.  Ejection fraction estimated at  greater than or equal to 60%.  There is no mitral regurgitation.   CORONARY ARTERIOGRAPHY (RIGHT DOMINANT):  Left main is normal.   Left anterior descending artery gives rise to a single normal size diagonal  branch.  The LAD is normal.   Left circumflex gives rise to a large branching first obtuse marginal and  small second obtuse marginal.  The left circumflex is normal.   Right coronary artery gives rise to a normal size posterior descending  artery and a normal size posterior lateral branch.  The right coronary  artery is normal.   IMPRESSION:  1.  Normal left ventricular systolic function.  2. Normal epicardial coronary arteries by angiography.                                               Carole Binning, M.D. [facility]    MWP/MEDQ  D:  09/13/2003  T:  09/13/2003  Job:  [id number]   cc:   Harrel Lemon. Merla Riches, M.D.  [address]  [place]  [place] [address]  Fax: [phone]

## 2011-03-12 NOTE — Op Note (Signed)
[facility]  Patient:    [name] [name], [name] [name]                   MRN: [id number] Proc. Date: 01/27/00 Adm. Date:  [id number] Attending:  Minette Headland                           Operative Report  PREOPERATIVE DIAGNOSIS: 1. Postmenopausal bleeding on hormone replacement therapy. 2. Endometrial thickening and suggestion of a small polyp on pelvic ultrasound.  POSTOPERATIVE DIAGNOSES: 1. Postmenopausal bleeding on hormone replacement therapy. 2. Endometrial thickening with small polyp identified and removed.  OPERATION: 1. Hysteroscopy 2. Dilatation and curettage.  SURGEON:  Freddy Finner, M.D.  ANESTHESIA:  General.  ESTIMATED INTRAOPERATIVE BLOOD LOSS:  10 cc.  INTRAOPERATIVE SORBITOL DEFICIT:  30 cc.  INTRAOPERATIVE COMPLICATIONS:  None.  IMPRESSION:  This is a 65 year old who is on hormone replacement therapy and had an episode of abnormal bleeding.  Pelvic ultrasound revealed 6 mm endometrium and hat was thought to be an endometrial polyp.  She is admitted now for hysteroscopy D&C.  DESCRIPTION OF PROCEDURE:  The patient was admitted on the morning of surgery and brought to the operating room and placed under adequate intravenous sedation. he was placed in the dorsolithotomy position.  Betadine prep was carried out in the usual fashion.  Bivalve speculum was introduced.  The cervix was grasped with the anterior lip of a single-tooth tenaculum.  Paracervical block was placed using 10 cc of 1% plain Xylocaine.  Injections were made at the 4 and 8 oclock positions in the vaginal fornices.  The cervix was sounded to 9.5 cm.  The cervix was progressively dilated with Pratts to #25.  The ACMI 12 degree hysteroscope was introduced using sorbitol as a distending medium.  Inspection revealed some areas with frond-like appearance on the posterior endometrial surface, particularly along the right side posteriorly at approximately the  8 oclock position.  There also were some areas of gladdened epithelium anteriorly and posteriorly on the right  side.  A small vascular-looking polyp was noted posteriorly at approximately 6 oclock on the upper third omentum.  Following inspection with the hysteroscope,  gentle thorough curettage was carried out.  Exploration with forceps and repeat  visualization to confirm complete sampling and removal of the polyp.  This was confirmed.  The procedure was terminated.  The patient was taken to the recovery room in good condition.  She will be discharge immediate postop period for followup in the office in approximately one week.  She is to take Aleve as needed for postoperative pain. DD:  01/27/00 TD:  01/27/00 Job: [id number]

## 2011-03-12 NOTE — Consult Note (Signed)
NAMESUSANNAH, CARBIN            ACCOUNT NO.:  [id number]   MEDICAL RECORD NO.:  [id number]          PATIENT TYPE:  OUT   LOCATION:  GYN                          FACILITY:  [facility]   PHYSICIAN:  John T. Kyla Balzarine, M.D.    DATE OF BIRTH:  Mar 04, 1946   DATE OF CONSULTATION:  01/17/2007  DATE OF DISCHARGE:                                 CONSULTATION   CHIEF COMPLAINT:  Followup of VIN.   HISTORY OF PRESENT ILLNESS:  This patient underwent wide local resection  of VIN in October 2006.  She has been alternating followup between Dr.  Leda Quail and our office since her last examination a year ago.  She denies vulvar pruritus, condylomatous lesions or ulcerative lesions.  All-in-all, she is doing quite well.  Past medical history of  hypothyroidism but no other major comorbidities.  Prior T&A,  cryosurgery, NSVD and wide local excision of the vulva.   MEDICATIONS:  Include Synthroid, multivitamins, and Zoloft.   ALLERGIES:  SHELLFISH allergy.   FAMILY HISTORY:  Denies gynecologic malignancy or breast cancer.   PERSONAL AND SOCIAL HISTORY:  Admits social ethanol but denies tobacco  or recreational drug use.   REVIEW OF SYSTEMS:  Comprehensive 10-point review of systems negative.  The patient does have negative associations with the cancer center  because of her husband's death of cancer.   EXAMINATION:  VITAL SIGNS:  Weight 166 pounds and vital signs stable.  GENERAL:  The patient is alert and oriented x3 in no acute distress.  No  pathologic lymphadenopathy.  No back or CVA tenderness.  ABDOMEN:  Soft  and benign with no ascites, mass, tenderness, organomegaly.  EXTREMITIES:  Have full strength and range of motion without edema.  PELVIC:  External genitalia have no suspicious lesions.  Surgical  incision well healed.  The vagina is atrophic and cervix is atrophic,  without lesions, and is mobile.  Bimanual and rectovaginal examinations  disclose small uterus without adnexal  pathology.   ASSESSMENT:  Vaginal intraepithelial neoplasia grade 3.   PLAN:  The patient to see Dr. Leda Quail for followup cytology in 6  months and if that is a negative examination, could be followed at  annual intervals.  We would be glad to see her back at any time on a  p.r.n. basis.  Today's Pap smear will be communicated to the patient.      John T. Kyla Balzarine, M.D.  Electronically Signed     JTS/MEDQ  D:  01/17/2007  T:  01/17/2007  Job:  [id number]   cc:   M. Leda Quail, MD  Fax: [phone]   Telford Nab, R.N.  [address]  [place], [place] [address]   Andres Ege, M.D.  Fax: [phone]

## 2011-03-12 NOTE — Op Note (Signed)
[name], [name] [name]            ACCOUNT NO.:  [id number]   MEDICAL RECORD NO.:  [id number]          PATIENT TYPE:  AMB   LOCATION:  DAY                          [facility]ACILITY[facility]  [facility]   PHYSICIAN:  Paola A. 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   PROCEDURE:  Wide local excision of vulva.   SURGEON:  Paola A. Duard Brady, MD.   ASSISTANT:  Telford Nab, R.N.   ANESTHESIA:  General with 10 cc 0.25% plain Marcaine.   ANESTHESIOLOGIST:  Quentin Cornwall Council Mechanic, M.D.   SPECIMENS:  Vulva.   ESTIMATED BLOOD LOSS:  Less than 50 cc.   INTRAVENOUS FLUIDS:  1100 cc.   URINE OUTPUT:  300 cc.   COMPLICATIONS:  None.   FINDINGS:  The findings included raised hyperkeratotic with acetowhite  epithelial changes measuring approximately 4 cm across 2 cm deep,  encompassing the entire portion posterior fourchette.   The patient was taken to the operating room, placed in the supine position  where general anesthesia was induced. She was then placed in dorsal  lithotomy  position with all appropriate precautions.  The perineum was  prepped and draped in the usual sterile fashion and she was I&O catheterized  for 300 cc under sterile conditions.  Acetic acid was then placed on the  vulva and the findings as above were noted.  Timeout was then performed, and  10 mL of 0.25% Marcaine was . We checked in a subcu fashion throughout the  lesion.  Using a knife, the lesion was outlined.  Skinning vulvectomy was  then performed.  Hemostasis was obtained using pinpoint cautery.  The lesion  was pegged out on a cork board and sent to pathology.  A deeper  reapproximating sutures were made in the lesion on the lateral sides.  We  then in  the midline portion of the posterior fourchette proceeded with a  vertical incision.  The skin was closed using a combination of running subcu  and interrupted sutures. The lesion was hemostatic at the completion.   She tolerated the procedure well and was taken to the recovery room in  stable condition. All the needle and Ray-Tec counts were correct x2.      Paola A. Duard Brady, MD  Electronically Signed     PAG/MEDQ  D:  08/17/2005  T:  08/17/2005  Job:  [id number]   cc:   Andres Ege, M.D.  Fax: [phone]   Telford Nab, R.N.  [address]  [place], [place] [address]   Edwena Felty. Romine, M.D.  Fax: [phone]

## 2011-03-12 NOTE — Consult Note (Signed)
[name], [name]            ACCOUNT NO.:  [id number]   MEDICAL RECORD NO.:  [id number]          PATIENT TYPE:  OUT   LOCATION:  GYN                            FACILITY:   PHYSICIAN:  John T. Kyla Balzarine, M.D.    DATE OF BIRTH:  29-Apr-1946   DATE OF CONSULTATION:  09/28/2005  DATE OF DISCHARGE:                                   CONSULTATION   CHIEF COMPLAINT:  Follow-up of VIN III and superficial wound separation.   HISTORY OF PRESENT ILLNESS:  The patient underwent wide excision of a  hyperkeratotic lesion encompassing the entire posterior introitus on August 17, 2005.  She had high-grade vulvar intraepithelial neoplasia with focally  positive margins.  She had a superficial partial separation in the perianal  region.  She notes this has continued to settle in but with increased  activity did have focal spotting and a sore region at the introitus.  Her  past medical and surgical histories are unchanged from those recorded  August 11, 2005.  She is on Synthroid, multivitamins, and Zoloft.  She is  allergic to shellfish.   FAMILY AND PERSONAL AND SOCIAL HISTORIES:  Are unchanged.   REVIEW OF SYSTEMS:  Otherwise negative; the patient otherwise at full  activity.   EXAM:  VITAL SIGNS:  Stable and afebrile as in clinic flow sheet.  The  operative site is inspected and suture line is intact with a focal area of  proud flesh involving the posterior introitus.  Suture line has essentially  healed.  Anal examination is negative.   ASSESSMENT:  VIN III.  Status post surgical resection.   PLAN:  The patient was encouraged regarding her current status.  We will see  her back in 3 months and begin alternating follow-up with Cynthia P. Romine,  M.D. for a few visits.      John T. Kyla Balzarine, M.D.  Electronically Signed     JTS/MEDQ  D:  09/28/2005  T:  09/29/2005  Job:  [id number]   cc:   Edwena Felty. Romine, M.D.  Fax: [phone]   Telford Nab, R.N.  [address]  [place], [place] [address]

## 2011-03-15 IMAGING — CR DG ANKLE COMPLETE 3+V*R*
3 series · 3 of 3 positions shown · non-contrast
Comparison: None

CLINICAL DATA: Status post injury to right ankle from golf club;
bilateral malleolar pain, with lateral malleolar swelling.

RIGHT ANKLE - COMPLETE 3+ VIEW

[t ankle joint ap right]
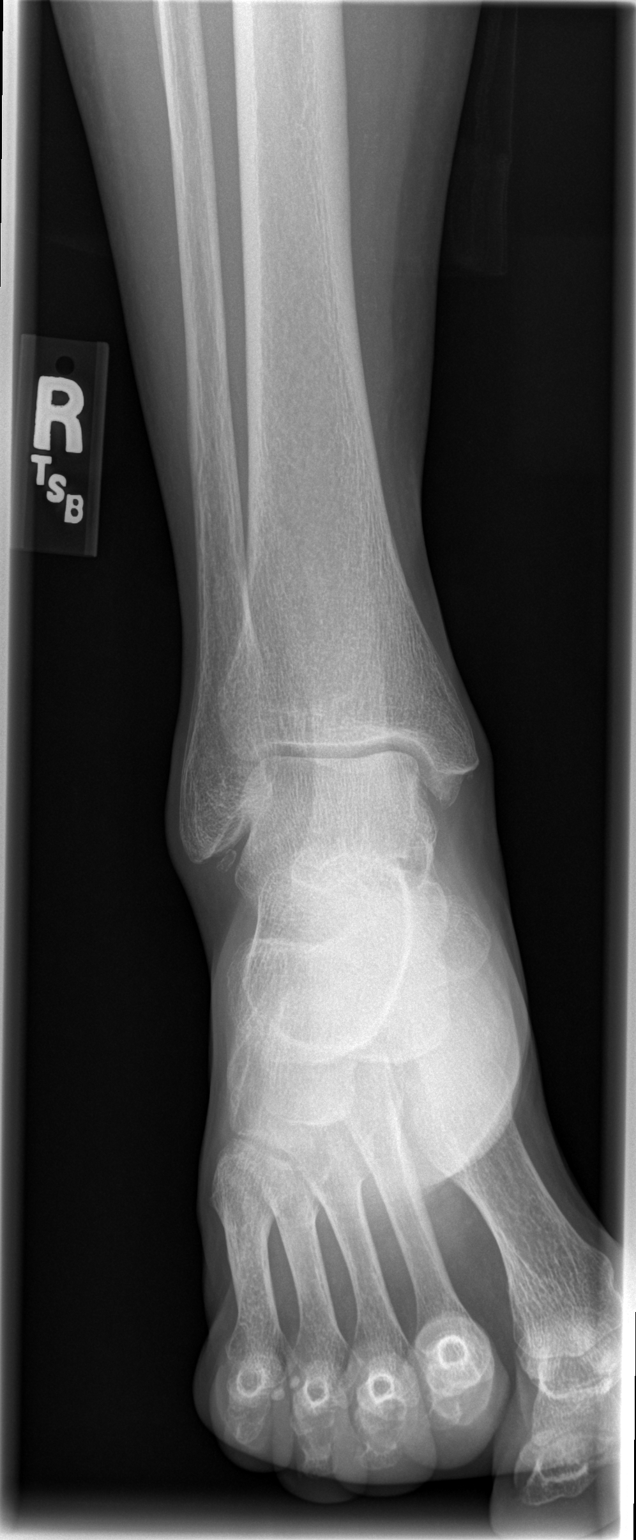

[t ankle joint oblique right]
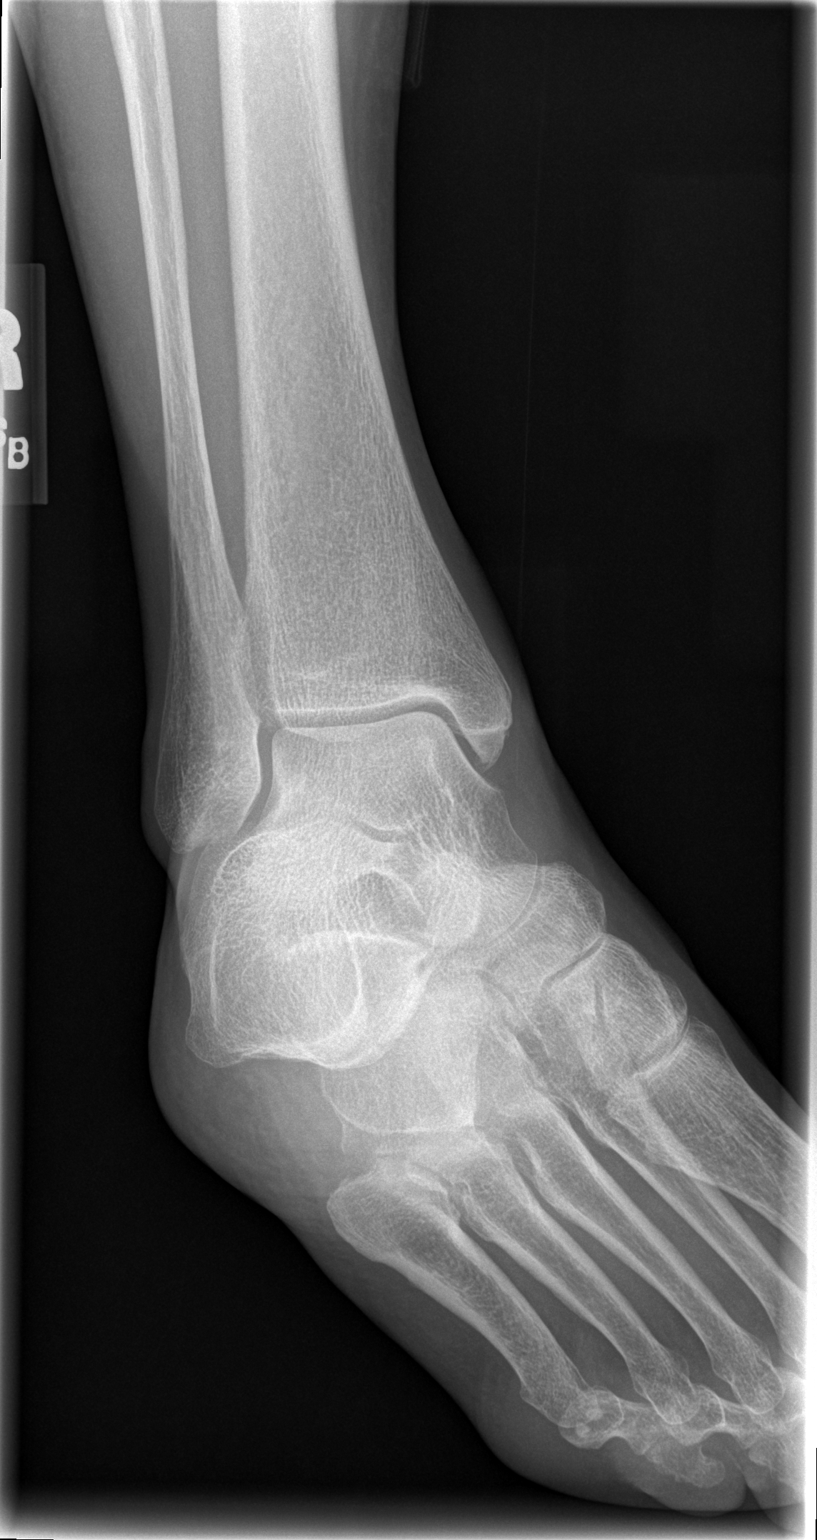

[t ankle joint lat right]
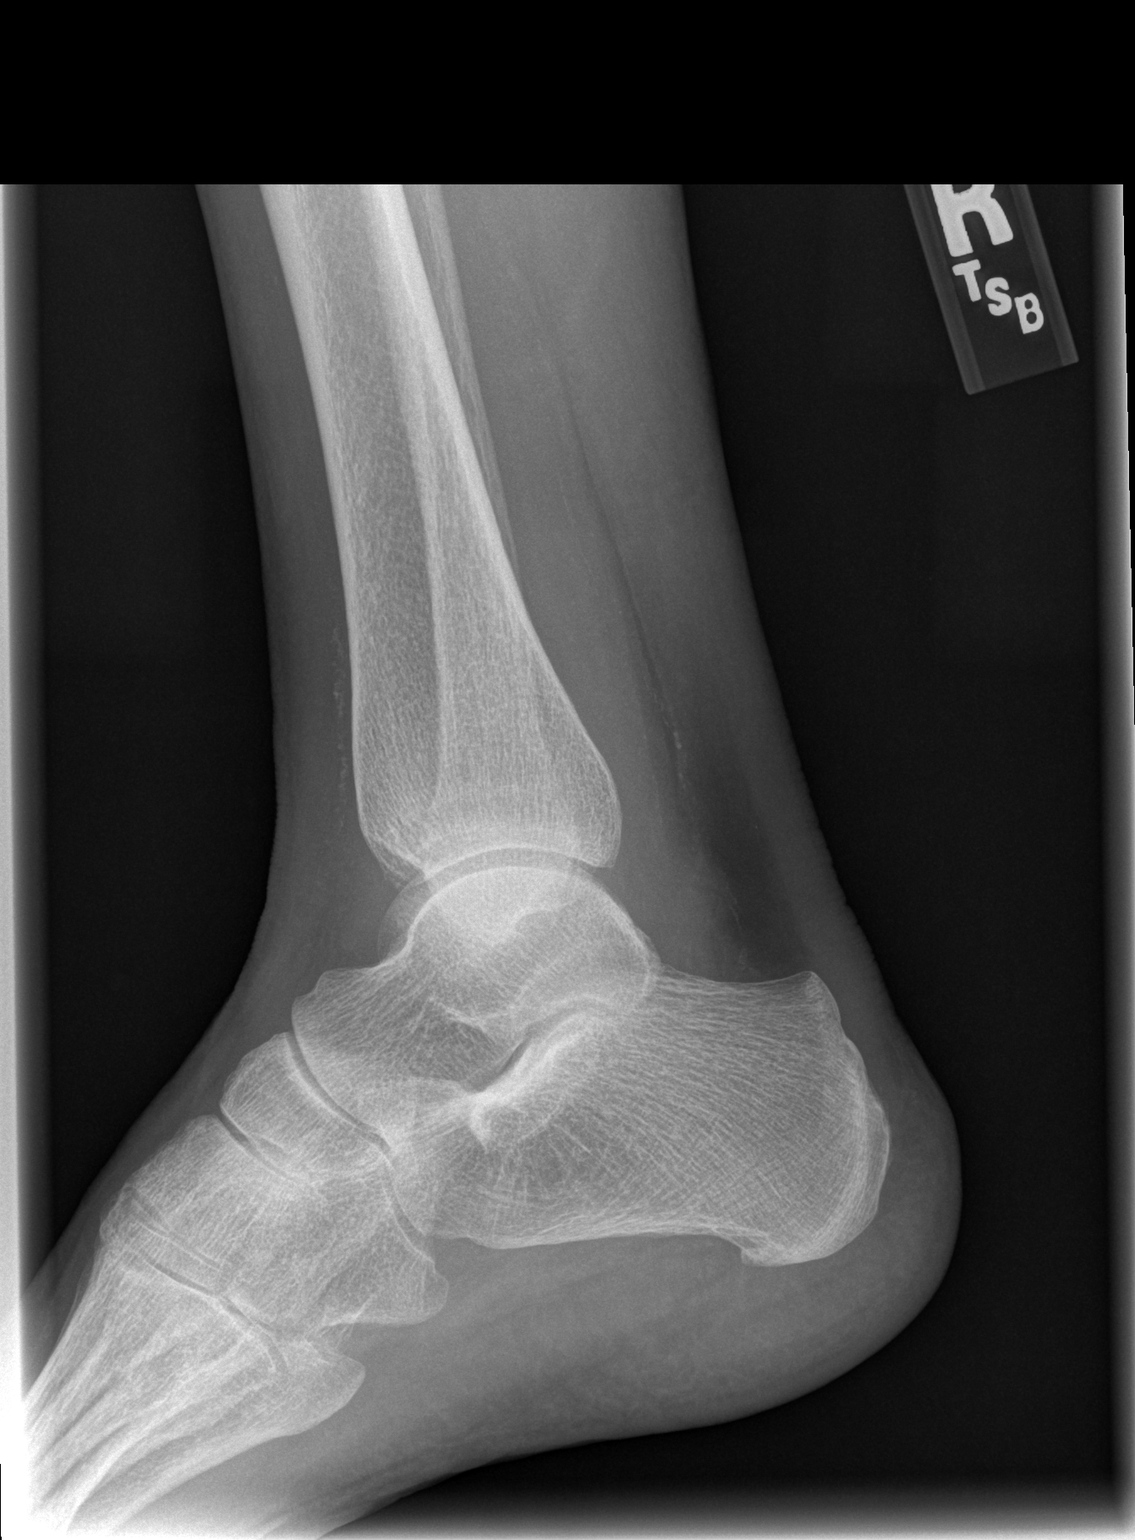

[3 of 3 positions shown; findings below may reference images not displayed]

FINDINGS: There is no evidence of acute fracture or dislocation.  A
small well-corticated fragment adjacent to the lateral malleolus
likely reflects prior injury.  The ankle mortise is intact; the
interosseous space is within normal limits.  No talar tilt or
subluxation is seen.

The joint spaces are preserved.  Scattered vascular calcifications
are noted.
IMPRESSION: 1.  No evidence of acute fracture or dislocation.
2.  Scattered vascular calcifications noted at the ankle.

## 2011-03-15 IMAGING — CR DG FOOT COMPLETE 3+V*R*
3 series · 3 of 3 positions shown · non-contrast
Comparison: None.

CLINICAL DATA: Right foot injury with pain.

RIGHT FOOT COMPLETE - 3+ VIEW

[t foot ap right]
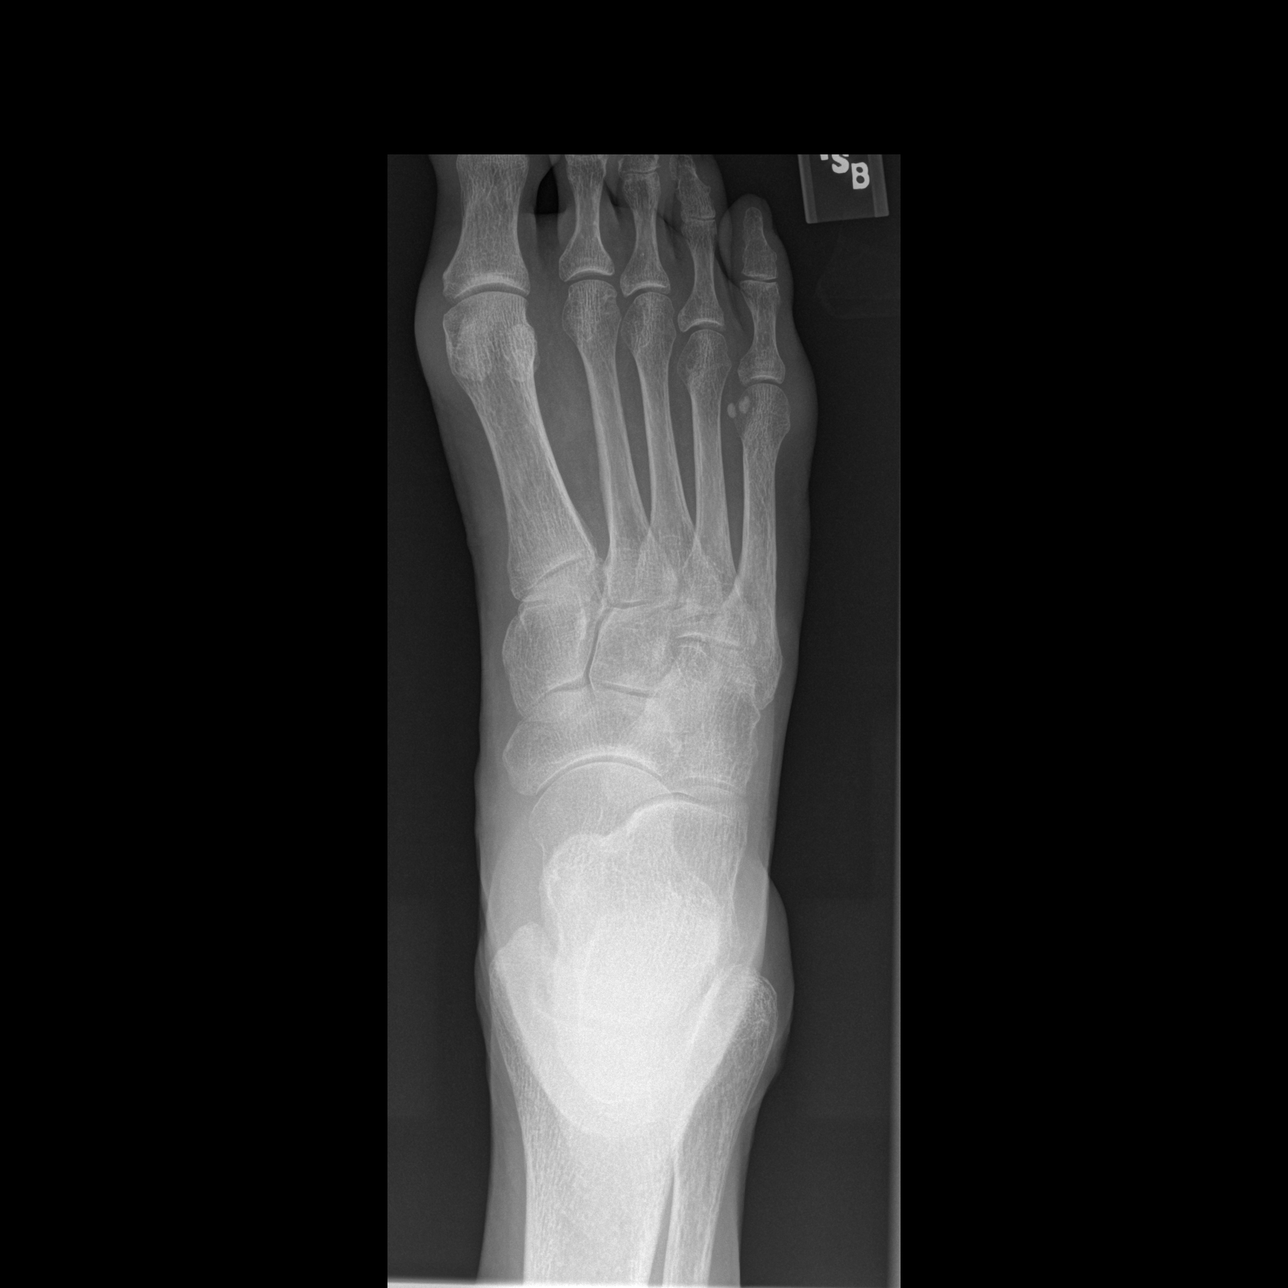

[t foot oblique right]
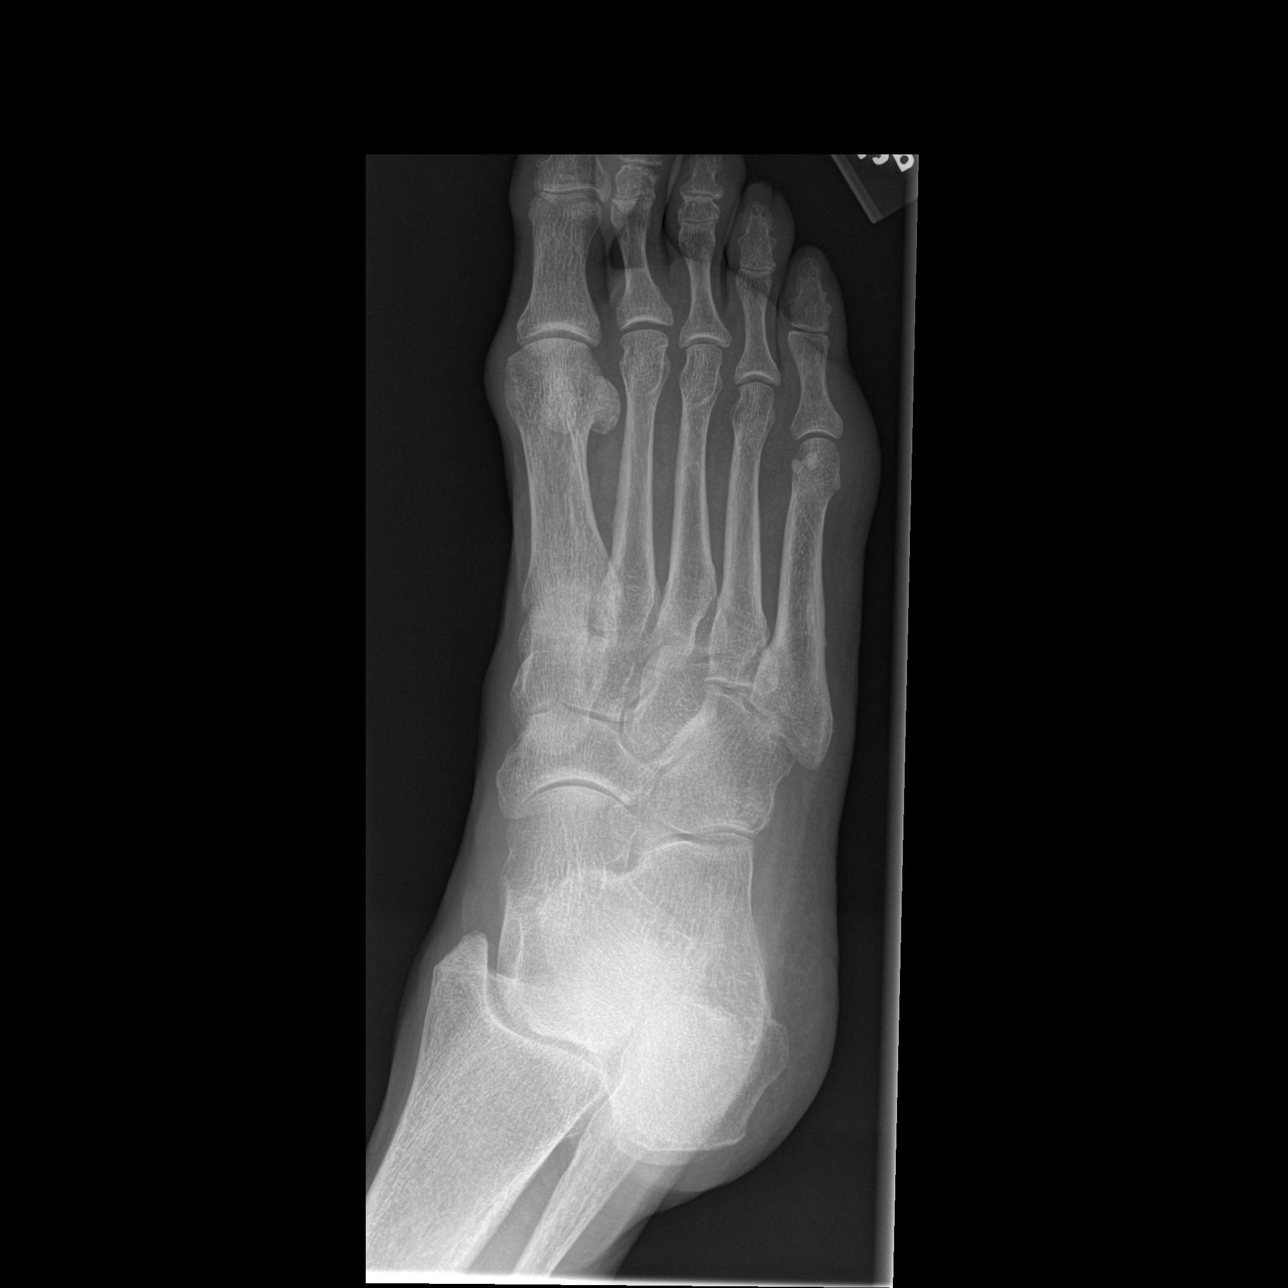

[t foot lat right]
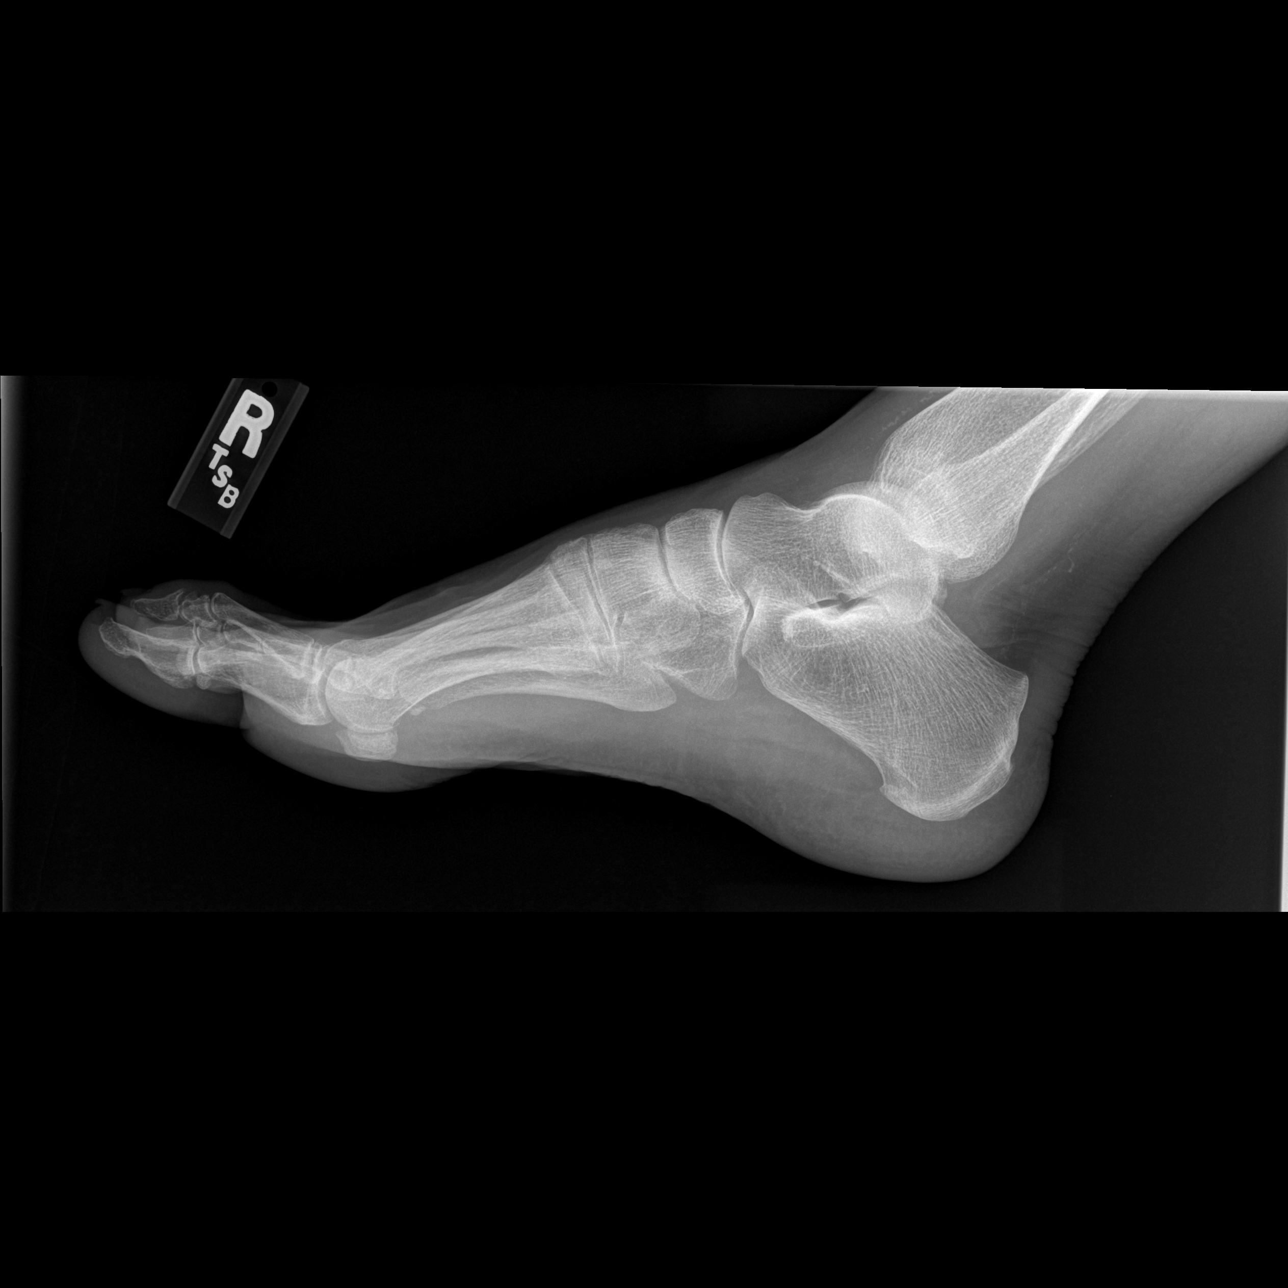

[3 of 3 positions shown; findings below may reference images not displayed]

FINDINGS: Alignment at the Lisfranc joint appears normal.  No
metatarsal fracture is identified.  There is some mild degenerative
loss of articular space at the interphalangeal joints of the toes.
IMPRESSION: 1.  No fracture is identified.
2.  Mild degenerative loss of articular space at the
interphalangeal joints.

## 2011-10-22 ENCOUNTER — Emergency Department (HOSPITAL_BASED_OUTPATIENT_CLINIC_OR_DEPARTMENT_OTHER)
Admission: EM | Admit: 2011-10-22 | Discharge: 2011-10-22 | Disposition: A | Discharge status: Home / Self Care | Payer: Medicare Other | Attending: Emergency Medicine | Admitting: Emergency Medicine

## 2011-10-22 ENCOUNTER — Telehealth: Payer: Self-pay | Admitting: Emergency Medicine

## 2011-10-22 ENCOUNTER — Emergency Department (INDEPENDENT_AMBULATORY_CARE_PROVIDER_SITE_OTHER): Payer: Medicare Other

## 2011-10-22 DIAGNOSIS — R05 Cough: Secondary | ICD-10-CM | POA: Insufficient documentation

## 2011-10-22 DIAGNOSIS — R0602 Shortness of breath: Secondary | ICD-10-CM | POA: Insufficient documentation

## 2011-10-22 DIAGNOSIS — J45909 Unspecified asthma, uncomplicated: Secondary | ICD-10-CM

## 2011-10-22 DIAGNOSIS — E079 Disorder of thyroid, unspecified: Secondary | ICD-10-CM | POA: Insufficient documentation

## 2011-10-22 DIAGNOSIS — J40 Bronchitis, not specified as acute or chronic: Secondary | ICD-10-CM

## 2011-10-22 DIAGNOSIS — R059 Cough, unspecified: Secondary | ICD-10-CM | POA: Insufficient documentation

## 2011-10-22 DIAGNOSIS — E785 Hyperlipidemia, unspecified: Secondary | ICD-10-CM | POA: Insufficient documentation

## 2011-10-22 DIAGNOSIS — Z79899 Other long term (current) drug therapy: Secondary | ICD-10-CM | POA: Insufficient documentation

## 2011-10-22 HISTORY — DX: Disorder of thyroid, unspecified: E07.9

## 2011-10-22 HISTORY — DX: Hyperlipidemia, unspecified: E78.5

## 2011-10-22 HISTORY — DX: Disorder of kidney and ureter, unspecified: N28.9

## 2011-10-22 MED ORDER — ALBUTEROL SULFATE (5 MG/ML) 0.5% IN NEBU
INHALATION_SOLUTION | RESPIRATORY_TRACT | Status: AC
  Filled 2011-10-22: qty 1

## 2011-10-22 MED ORDER — AMOXICILLIN 500 MG PO CAPS: 500.0000 mg | ORAL_CAPSULE | Freq: Two times a day (BID) | ORAL | Status: DC

## 2011-10-22 MED ORDER — IPRATROPIUM BROMIDE 0.02 % IN SOLN
0.5000 mg | Freq: Once | RESPIRATORY_TRACT | Status: AC
  Administered 2011-10-22: 0.5 mg via RESPIRATORY_TRACT

## 2011-10-22 MED ORDER — IPRATROPIUM BROMIDE 0.02 % IN SOLN
RESPIRATORY_TRACT | Status: AC
  Filled 2011-10-22: qty 2.5

## 2011-10-22 MED ORDER — ALBUTEROL SULFATE (5 MG/ML) 0.5% IN NEBU
5.0000 mg | INHALATION_SOLUTION | Freq: Once | RESPIRATORY_TRACT | Status: AC
  Administered 2011-10-22: 5 mg via RESPIRATORY_TRACT

## 2011-10-22 NOTE — ED Provider Notes (Signed)
History     CSN: [id number]  Arrival date & time 10/22/11  1739   First MD Initiated Contact with Patient 10/22/11 1802      Chief Complaint  Patient presents with  . Cough  . Wheezing    (Consider location/radiation/quality/duration/timing/severity/associated sxs/prior treatment) HPI Comments: Pt states that she has been seen multiple times over the last month and has been treated with inhaler and cough syrup and the symptoms seem to be getting worse and not better  Patient is a 65 y.o. female presenting with cough and wheezing. The history is provided by the patient. No language interpreter was used.  Cough This is a recurrent problem. The current episode started more than 1 week ago. The problem occurs hourly. The problem has been gradually worsening. The cough is productive of sputum. There has been no fever. Associated symptoms include shortness of breath and wheezing. Pertinent negatives include no chest pain, no chills, no rhinorrhea, no sore throat and no myalgias. She has tried cough syrup for the symptoms. She is not a smoker. Her past medical history is significant for bronchitis.  Wheezing  Associated symptoms include cough, shortness of breath and wheezing. Pertinent negatives include no chest pain, no rhinorrhea and no sore throat.    Past Medical History  Diagnosis Date  . Hyperlipemia   . Thyroid disease   . Renal disorder     History reviewed. No pertinent past surgical history.  No family history on file.  History  Substance Use Topics  . Smoking status: Never Smoker   . Smokeless tobacco: Never Used  . Alcohol Use: Yes     occasional    OB History    Grav Para Term Preterm Abortions TAB SAB Ect Mult Living                  Review of Systems  Constitutional: Negative for chills.  HENT: Negative for sore throat and rhinorrhea.   Respiratory: Positive for cough, shortness of breath and wheezing.   Cardiovascular: Negative for chest pain.    Musculoskeletal: Negative for myalgias.  All other systems reviewed and are negative.    Allergies  Prednisone and Sulfonamide derivatives  Home Medications   Current Outpatient Rx  Name Route Sig Dispense Refill  . ALBUTEROL SULFATE HFA 108 (90 BASE) MCG/ACT IN AERS Inhalation Inhale 2 puffs into the lungs every 4 (four) hours as needed. For shortness of breath and wheezing     . BENZONATATE 100 MG PO CAPS Oral Take 100 mg by mouth 3 (three) times daily as needed. For cough     . HYDROCOD POLST-CHLORPHEN POLST 10-8 MG/5ML PO LQCR Oral Take 5 mLs by mouth at bedtime as needed. For cough     . DM-GUAIFENESIN ER 30-600 MG PO TB12 Oral Take 1 tablet by mouth every 12 (twelve) hours.      . GUAIFENESIN 100 MG/5ML PO LIQD Oral Take 200 mg by mouth 3 (three) times daily as needed. For cough     . GUAIFENESIN-DM 100-10 MG/5ML PO SYRP Oral Take 10 mLs by mouth 3 (three) times daily as needed. For cough     . LEVOTHYROXINE SODIUM 112 MCG PO TABS Oral Take 112 mcg by mouth daily.      . SERTRALINE HCL 100 MG PO TABS Oral Take 100 mg by mouth daily.        BP 128/70  Pulse 91  Temp(Src) 98.4 F (36.9 C) (Oral)  Resp 20  Ht 5\' 8"  (  1.727 m)  Wt 170 lb (77.111 kg)  BMI 25.85 kg/m2  SpO2 94%  Physical Exam  Nursing note and vitals reviewed. Constitutional: She is oriented to person, place, and time. She appears well-developed and well-nourished.  HENT:  Head: Normocephalic and atraumatic.  Right Ear: External ear normal.  Left Ear: External ear normal.  Mouth/Throat: Oropharynx is clear and moist.  Neck: Normal range of motion.  Cardiovascular: Normal rate and regular rhythm.   Musculoskeletal: Normal range of motion.  Neurological: She is alert and oriented to person, place, and time.  Skin: Skin is warm and dry.  Psychiatric: She has a normal mood and affect.    ED Course  Procedures (including critical care time)  Labs Reviewed - No data to display Dg Chest 2  View  10/22/2011  *RADIOLOGY REPORT*  Clinical Data: Cough, shortness of breath, asthma  CHEST - 2 VIEW  Comparison: 10/21/2011  Findings: Cardiomediastinal silhouette is stable.  No acute infiltrate or pleural effusion.  Stable hyperinflation and chronic mild interstitial prominence.  Bony thorax is stable.  IMPRESSION: No active disease.  Stable hyperinflation and chronic mild interstitial prominence.  Original Report Authenticated By: Natasha Mead, M.D.     1. Bronchitis       MDM   Pt no longer wheezing after treatment:pt is having persistent coughing:pt has not gotten any relief since with cough syrups:pt has tried multiple things       Teressa Lower, NP 10/22/11 1918

## 2011-10-22 NOTE — Telephone Encounter (Signed)
Called spoke with patient who was audibly SOB and advised her to call 911 or go to the ER for immediate evaluation.  Pt stated she "was at the doctor yesterday and got a cortisol shot" and was told that she should be feeling better today but she does not.  Pt stated that she does not want to go to the ED.  I advised pt that due to the late time of the call, her obvious SOB and the fact that she got the steroid shot yesterday with no improvement that the ED/911 is the best option for her.  Pt stated she will go to the [facility].  Will sign off and forward to RB to make him aware.

## 2011-10-22 NOTE — ED Notes (Signed)
Pt had MDI from home.  However no spacer.  Spacer given to pt and showed proper technique.

## 2011-10-22 NOTE — ED Notes (Signed)
Pt reports she has been sick since November, seen her PMD x 3 and continues to cough, wheeze and have SHOB.

## 2011-10-24 NOTE — ED Provider Notes (Signed)
History/physical exam/procedure(s) were performed by non-physician practitioner and as supervising physician I was immediately available for consultation/collaboration. I have reviewed all notes and am in agreement with care and plan.   Ralph Benavidez S Deyna Carbon, MD 10/24/11 1851 

## 2011-10-27 ENCOUNTER — Encounter: Payer: Self-pay | Admitting: Critical Care Medicine

## 2011-10-28 ENCOUNTER — Encounter: Payer: Self-pay | Admitting: Critical Care Medicine

## 2011-10-28 ENCOUNTER — Ambulatory Visit (INDEPENDENT_AMBULATORY_CARE_PROVIDER_SITE_OTHER): Payer: Medicare Other | Admitting: Critical Care Medicine

## 2011-10-28 DIAGNOSIS — R05 Cough: Secondary | ICD-10-CM

## 2011-10-28 DIAGNOSIS — R059 Cough, unspecified: Secondary | ICD-10-CM

## 2011-10-28 MED ORDER — BENZONATATE 100 MG PO CAPS: ORAL_CAPSULE | ORAL | Status: DC

## 2011-10-28 MED ORDER — HYDROCOD POLST-CHLORPHEN POLST 10-8 MG/5ML PO LQCR: 5.0000 mL | Freq: Two times a day (BID) | ORAL | Status: DC | PRN

## 2011-10-28 MED ORDER — CHLORPHENIRAMINE MALEATE CR 8 MG PO CPCR: 8.0000 mg | ORAL_CAPSULE | Freq: Two times a day (BID) | ORAL | Status: AC

## 2011-10-28 MED ORDER — PREDNISONE 10 MG PO TABS: ORAL_TABLET | ORAL | Status: DC

## 2011-10-28 MED ORDER — OMEPRAZOLE 20 MG PO CPDR: 20.0000 mg | DELAYED_RELEASE_CAPSULE | Freq: Every day | ORAL | Status: DC

## 2011-10-28 NOTE — Progress Notes (Signed)
Subjective:    Patient ID: [name] [name], female    DOB: [birth year], 66 y.o.   MRN: [id number]  HPI 65 y.o. F with moderate persistent asthma [facility] pulm pt.  Primary Provider/Referring Provider: Dr Duane Lope  CC: Acute Visit , pt has not been seen since Feb. 2010, c/o increase sob worse with exertion, also feels she has to clear her throat frequently, and was taking spiriva but stopped 2 weeks agos.  History of Present Illness:  66 y.o.  woman with little PMH, never smoker. Began to notice back in 2004 that she was having lower exertional tolerance, SOB, L arm pain. Had an abnormal stress test, prompted cardiac cath in 2005 - no intervention. Repeat stress test more recently showed no ischemia (Dr Eldridge Dace). Stays active, but still unable to exert significantly. She has had a vascular eval as well, CT-PA showed patent LUE arterial tree but with decreased ulnar and radial filling (? significance). Segmental pressures normal.  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  Feb 11, 10 -- Returns for follow up and eval, has had TTE since last visit. Began to have nasal congestion, dry cough with feeling of something in her throat, frequent clearing of the throat. Has tried proventil, but this did noy help the cough. Has tried pseudophed, benadryl with some relief. Nose is not stuffed, has drainage down the back of the throat. Continues to have exertional SOB with stairs, other exercise.  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 Tells me that in May '11 she had a bad URI - cough, body aches. Led to some breathing difficulty. She performed Spirometry, a CXR, was started on Spiriva. The CXR showed borderline large lung volumes. Stopped the Spiriva 2 weeks ago due to hoarse voice. She continues to have SOB with exertion, gets SOB after walking over 100 ft. Able to play golf. The Spiriva hasn't helped her breathing  in any way. She also has tried Ventolin, no change. Formerly went to [facility], not currently. Last TSH was in April, was OK.   10/28/2011 Not seen since 11/11.Ok until started cough around thanksgiving then worse 09/29/11:  Went to walkin clinic Rx doxy and albuterol and cough syrup.  Not improved. Then started cipro x 10days.  Still not better, 12/17: PCP no changes offered except amoxillin.  CXR normal.  Then still coughing, 12/27: saw PCP again, rx amoxil 1500 bid x 5days plus steroid injection.  Now no better, another xray last week abn with bronchitis.  Cough:  occ productive clear, occ dry.  Hard to raise.  Notes wheezing.  Albuterol helps a bit but not much.  Notes some pndrip,  Is hoarse, no heartburn, no belching.  No energy is noted and not eating. Notes chest pain heaviness.     Review of Systems     Objective:   Physical Exam Filed Vitals:   10/28/11 1413  BP: 136/80  Pulse: 82  Temp: 97.7 F (36.5 C)  TempSrc: Oral  Height: 5' 8.5" (1.74 m)  Weight: 176 lb (79.833 kg)  SpO2: 95%    Gen: anxious WF, hoarse, in no distress,  normal affect  ENT: No lesions,  mouth erythematous post oropharynx c/w GERD ,  oropharynx clear, +++ postnasal drip,   Neck: No JVD, no TMG, no carotid bruits  Lungs: No use of accessory muscles, no dullness to percussion, moderate pseudowheeze  Cardiovascular: RRR,  heart sounds normal, no murmur or gallops, no peripheral edema  Abdomen: soft and NT, no HSM,  BS normal  Musculoskeletal: No deformities, no cyanosis or clubbing  Neuro: alert, non focal  Skin: Warm, no lesions or rashes          Assessment & Plan:   Cough Cyclical cough on basis of upper airway instability , GERD, postnasal drip /rhinitis syndrome.  Hx of lower airway obstruction in past not yet labelled as asthma/RAD  Plan Start cyclic cough protocol: tussionex/tessalon  Follow a strict reflux diet Start prednisone 10mg  Take 4 for three days 3 for three days 2 for  three days 1 for three days and stop Start omeprazole one daily Return 10-14 days with Jasmine Awe at [address] office     Updated Medication List Outpatient Encounter Prescriptions as of 10/28/2011  Medication Sig Dispense Refill  . albuterol (PROVENTIL HFA;VENTOLIN HFA) 108 (90 BASE) MCG/ACT inhaler Inhale 2 puffs into the lungs every 4 (four) hours as needed. For shortness of breath and wheezing       . amoxicillin (AMOXIL) 500 MG capsule Take 1,500 mg by mouth 2 (two) times daily.        . benzonatate (TESSALON) 100 MG capsule Take 1-2 every 6 hours as needed per cough protocol  90 capsule  6  . chlorpheniramine-HYDROcodone (TUSSIONEX PENNKINETIC ER) 10-8 MG/5ML LQCR Take 5 mLs by mouth every 12 (twelve) hours as needed. For cough  240 mL  0  . hydrochlorothiazide (HYDRODIURIL) 25 MG tablet Take 25 mg by mouth daily.        Marland Kitchen levothyroxine (SYNTHROID, LEVOTHROID) 112 MCG tablet Take 112 mcg by mouth daily.        . rosuvastatin (CRESTOR) 10 MG tablet Take 10 mg by mouth 3 (three) times a week.        . sertraline (ZOLOFT) 100 MG tablet Take 100 mg by mouth daily.        Marland Kitchen DISCONTD: amoxicillin (AMOXIL) 500 MG capsule Take 1 capsule (500 mg total) by mouth 2 (two) times daily.  20 capsule  0  . DISCONTD: benzonatate (TESSALON) 100 MG capsule Take 100 mg by mouth 3 (three) times daily as needed. For cough       . DISCONTD: chlorpheniramine-HYDROcodone (TUSSIONEX PENNKINETIC ER) 10-8 MG/5ML LQCR Take 5 mLs by mouth at bedtime as needed. For cough       . DISCONTD: dextromethorphan-guaiFENesin (MUCINEX DM) 30-600 MG per 12 hr tablet Take 1 tablet by mouth every 12 (twelve) hours.        Marland Kitchen DISCONTD: guaiFENesin-dextromethorphan (ROBITUSSIN DM) 100-10 MG/5ML syrup Take 10 mLs by mouth 3 (three) times daily as needed. For cough       . chlorpheniramine (CHLOR-TRIMETON) 8 MG capsule Take 1 capsule (8 mg total) by mouth 2 (two) times daily.  30 capsule  6  . omeprazole (PRILOSEC) 20 MG capsule Take 1  capsule (20 mg total) by mouth daily.  30 capsule  4  . predniSONE (DELTASONE) 10 MG tablet Take 4 for three days 3 for three days 2 for three days 1 for three days and stop  30 tablet  0  . DISCONTD: guaiFENesin (ROBITUSSIN) 100 MG/5ML liquid Take 200 mg by mouth 3 (three) times daily as needed. For cough

## 2011-10-28 NOTE — Patient Instructions (Signed)
Start cyclic cough protocol: tussionex/tessalon  Follow a strict reflux diet Start prednisone 10mg  Take 4 for three days 3 for three days 2 for three days 1 for three days and stop Start omeprazole one daily Return 10-14 days with Jasmine Awe at [address] office

## 2011-10-29 NOTE — Assessment & Plan Note (Signed)
Cyclical cough on basis of upper airway instability , GERD, postnasal drip /rhinitis syndrome.  Hx of lower airway obstruction in past not yet labelled as asthma/RAD  Plan Start cyclic cough protocol: tussionex/tessalon  Follow a strict reflux diet Start prednisone 10mg  Take 4 for three days 3 for three days 2 for three days 1 for three days and stop Start omeprazole one daily Return 10-14 days with Jasmine Awe at [address] office

## 2011-11-10 ENCOUNTER — Ambulatory Visit: Payer: Medicare Other | Admitting: Emergency Medicine

## 2011-11-15 ENCOUNTER — Encounter: Payer: Self-pay | Admitting: Emergency Medicine

## 2011-11-15 ENCOUNTER — Ambulatory Visit (INDEPENDENT_AMBULATORY_CARE_PROVIDER_SITE_OTHER): Payer: Medicare Other | Admitting: Emergency Medicine

## 2011-11-15 VITALS — BP 130/72 | HR 111 | Temp 98.4°F | Ht 68.5 in | Wt 180.0 lb

## 2011-11-15 DIAGNOSIS — R059 Cough, unspecified: Secondary | ICD-10-CM

## 2011-11-15 DIAGNOSIS — R05 Cough: Secondary | ICD-10-CM

## 2011-11-15 NOTE — Progress Notes (Signed)
Subjective:    Patient ID: [name] [name], female    DOB: 05/01/1946, 66 y.o.   MRN: [id number]  HPI 66 y.o. 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  woman with little PMH, never smoker. Began to notice back in 2004 that she was having lower exertional tolerance, SOB, L arm pain. Had an abnormal stress test, prompted cardiac cath in 2005 - no intervention. Repeat stress test more recently showed no ischemia (Dr Eldridge Dace). Stays active, but still unable to exert significantly. She has had a vascular eval as well, CT-PA showed patent LUE arterial tree but with decreased ulnar and radial filling (? significance). Segmental pressures normal.  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 Tells me that in May '11 she had a bad URI - cough, body aches. Led to some breathing difficulty. She performed Spirometry, a CXR, was started on Spiriva. The CXR showed borderline large lung volumes. Stopped the Spiriva 2 weeks ago due to hoarse voice. She continues to have SOB with exertion, gets SOB after walking over 100 ft. Able to play golf. The Spiriva hasn't helped her breathing in any way. She also has tried Ventolin, no change. Formerly went to [facility], not currently. Last TSH was in April, was OK.    Not seen since 11/11.Ok until started cough around thanksgiving then worse 09/29/11:  Went to walkin clinic Rx doxy and albuterol and cough syrup.  Not improved. Then started cipro x 10days.  Still not better, 12/17: PCP no changes offered except amoxillin.  CXR normal.  Then still coughing, 12/27: saw PCP again, rx amoxil 1500 bid x 5days plus steroid injection.  Now no better, another xray last week abn with bronchitis.  Cough:  occ productive clear, occ dry.  Hard to  raise.  Notes wheezing.  Albuterol helps a bit but not much.  Notes some pndrip,  Is hoarse, no heartburn, no belching.  No energy is noted and not eating. Notes chest pain heaviness.    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 Seen by Dr Delford Field 1/3 for persistent cough. She started cyclical cough protocol, finished a pred taper, started omeprazole. Cough was better for 10 days while on pred, then back. She continues omeprazole daily. She was on loratadine or another allergy med but ran out. She takes benadryl every night.       Objective:   Physical Exam Filed Vitals:   11/15/11 1532  BP: 130/72  Pulse: 111  Temp: 98.4 F (36.9 C)  TempSrc: Oral  Height: 5' 8.5" (1.74 m)  Weight: 180 lb (81.647 kg)  SpO2: 97%    Gen: anxious WF, hoarse, in no distress,  normal affect  ENT: No lesions,  mouth erythematous and crowded,  oropharynx clear, +++ postnasal drip,   Neck: No JVD, no TMG, no carotid bruits  Lungs: No use of accessory muscles, no dullness to percussion, moderate pseudowheeze  Cardiovascular: RRR, heart sounds normal, no murmur or gallops, no peripheral edema  Musculoskeletal: No deformities, no cyanosis or clubbing  Neuro: alert, non focal  Skin: Warm, no lesions or rashes     Assessment & Plan:   Cough - she will continue the cyclical cough protocol  -  continue omeprazole - restart loratadine - start nasal steroid - continue benadryl - rov 2 -3 weeks.

## 2011-11-15 NOTE — Assessment & Plan Note (Signed)
-   she will continue the cyclical cough protocol  - continue omeprazole - restart loratadine - start nasal steroid - continue benadryl - rov 2 -3 weeks.

## 2011-11-15 NOTE — Patient Instructions (Signed)
Please continue your benadryl each night Continue omeprazole (Prilosec) daily for now Restart loratadine 10mg  daily Start Nasonex 2 sprays each nostril daily Continue to use the techniques detailed in your cyclical cough protocol Follow with Dr Delton Coombes in 2 -3 weeks

## 2011-11-19 ENCOUNTER — Telehealth: Payer: Self-pay | Admitting: Critical Care Medicine

## 2011-11-19 MED ORDER — PREDNISONE 10 MG PO TABS: ORAL_TABLET | ORAL | Status: DC

## 2011-11-19 NOTE — Telephone Encounter (Signed)
Pt says her cough has not improved since she saw Dr. Delton Coombes on 11/15/11. She says the cough is prod with clear mucus and she thinks she needs Prednisone. This helped when this was going on once before.  She is following instructions given to her at [facility] on the 21st but it is not helping. Pls advise. Allergies  Allergen Reactions  . Prednisone     Face turns red  . Shellfish Allergy Swelling  . Sulfonamide Derivatives Nausea Only

## 2011-11-19 NOTE — Telephone Encounter (Signed)
rx prednisone 10mg  Take 4 for three days 3 for three days 2 for three days 1 for three days and stop #30

## 2011-11-19 NOTE — Telephone Encounter (Signed)
Rx has been sent and pt is aware of the directions

## 2012-01-12 ENCOUNTER — Ambulatory Visit: Payer: Medicare Other | Attending: Orthopaedic Surgery | Admitting: Physical Therapy

## 2012-01-12 DIAGNOSIS — IMO0001 Reserved for inherently not codable concepts without codable children: Secondary | ICD-10-CM | POA: Insufficient documentation

## 2012-01-12 DIAGNOSIS — M2569 Stiffness of other specified joint, not elsewhere classified: Secondary | ICD-10-CM | POA: Insufficient documentation

## 2012-01-12 DIAGNOSIS — M25519 Pain in unspecified shoulder: Secondary | ICD-10-CM | POA: Insufficient documentation

## 2012-01-12 DIAGNOSIS — M542 Cervicalgia: Secondary | ICD-10-CM | POA: Insufficient documentation

## 2012-01-24 ENCOUNTER — Encounter: Payer: Medicare Other | Admitting: Physical Therapy

## 2012-01-24 ENCOUNTER — Ambulatory Visit (INDEPENDENT_AMBULATORY_CARE_PROVIDER_SITE_OTHER): Payer: Medicare Other | Admitting: Critical Care Medicine

## 2012-01-24 ENCOUNTER — Telehealth: Payer: Self-pay | Admitting: Emergency Medicine

## 2012-01-24 ENCOUNTER — Encounter: Payer: Self-pay | Admitting: Critical Care Medicine

## 2012-01-24 VITALS — BP 122/78 | HR 81 | Temp 97.5°F | Ht 68.5 in | Wt 175.0 lb

## 2012-01-24 DIAGNOSIS — R05 Cough: Secondary | ICD-10-CM

## 2012-01-24 DIAGNOSIS — R059 Cough, unspecified: Secondary | ICD-10-CM

## 2012-01-24 MED ORDER — PREDNISONE 10 MG PO TABS: ORAL_TABLET | ORAL | Status: DC

## 2012-01-24 MED ORDER — HYDROCOD POLST-CHLORPHEN POLST 10-8 MG/5ML PO LQCR: 5.0000 mL | Freq: Two times a day (BID) | ORAL | Status: DC | PRN

## 2012-01-24 MED ORDER — BENZONATATE 100 MG PO CAPS: ORAL_CAPSULE | ORAL | Status: DC

## 2012-01-24 MED ORDER — FLUTICASONE PROPIONATE 50 MCG/ACT NA SUSP: 2.0000 | Freq: Every day | NASAL | Status: DC

## 2012-01-24 NOTE — Assessment & Plan Note (Signed)
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

## 2012-01-24 NOTE — Telephone Encounter (Signed)
I spoke with pt and she states she has been experiencing some wheezing and chest congestion x 1 week. She is currently on an ABX and has had 2 shots of prednisone. Pt is scheduled to come in and see TP tomorrow at 4:00 to be evaluated since pt could not come in today bc she has an apt with cardiology. Nothing further was needed

## 2012-01-24 NOTE — Patient Instructions (Addendum)
REsume cough protocol with tussionex and tessalon Follow strict reflux diet Take prednisone taper USe flonase two puff each nostril daily Finish levaquin Labs today Return to Dr byrum in two weeks

## 2012-01-24 NOTE — Progress Notes (Signed)
Subjective:    Patient ID: [name] [name], female    DOB: [birth year], 65 y.o.   MRN: [id number]  HPI History of Present Illness:  66 y.o.  woman with little PMH, never smoker. Began to notice back in 2004 that she was having lower exertional tolerance, SOB, L arm pain. Had an abnormal stress test, prompted cardiac cath in 2005 - no intervention. Repeat stress test more recently showed no ischemia (Dr Eldridge Dace). Stays active, but still unable to exert significantly. She has had a vascular eval as well, CT-PA showed patent LUE arterial tree but with decreased ulnar and radial filling (? significance). Segmental pressures normal.  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 Tells me that in May '11 she had a bad URI - cough, body aches. Led to some breathing difficulty. She performed Spirometry, a CXR, was started on Spiriva. The CXR showed borderline large lung volumes. Stopped the Spiriva 2 weeks ago due to hoarse voice. She continues to have SOB with exertion, gets SOB after walking over 100 ft. Able to play golf. The Spiriva hasn't helped her breathing in any way. She also has tried Ventolin, no change. Formerly went to [employer], not currently. Last TSH was in April, was OK.    Not seen since 11/11.Ok until started cough around thanksgiving then worse 09/29/11:  Went to walkin clinic Rx doxy and albuterol and cough syrup.  Not improved. Then started cipro x 10days.  Still not better, 12/17: PCP no changes offered except amoxillin.  CXR normal.  Then still coughing, 12/27: saw PCP again, rx amoxil 1500 bid x 5days plus steroid injection.  Now no better, another xray last week abn with bronchitis.  Cough:  occ productive clear, occ dry.  Hard to raise.  Notes wheezing.  Albuterol helps a bit but not much.  Notes some pndrip,  Is hoarse, no heartburn, no belching.   No energy is noted and not eating. Notes chest pain heaviness.    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 Seen by Dr Delford Field 1/3 for persistent cough. She started cyclical cough protocol, finished a pred taper, started omeprazole. Cough was better for 10 days while on pred, then back. She continues omeprazole daily. She was on loratadine or another allergy med but ran out. She takes benadryl every night.   01/24/2012 Notes cough is congested and wheezing worse.  THroat is worse. Rx steroid injection and rocephin at local hosp at the [facility].  Albuterol did not help. No inhalers help CXR was clear.    Past Medical History  Diagnosis Date  . Hyperlipemia   . Thyroid disease   . Renal disorder      Family History  Problem Relation Age of Onset  . Emphysema Mother   . Rheum arthritis Maternal Grandmother   . Heart disease Father      History   Social History  . Marital Status: Married    Spouse Name: N/A    Number of Children: N/A  . Years of Education: N/A   Occupational History  . Not on file.   Social History Main Topics  . Smoking status: Never Smoker   . Smokeless tobacco: Never Used   Comment: passive cigarette smoke exposure  . Alcohol Use: Yes     occasional  . Drug Use:  No  . Sexually Active: Not on file   Other Topics Concern  . Not on file   Social History Narrative  . No narrative on file     Allergies  Allergen Reactions  . Prednisone     Face turns red  . Shellfish Allergy Swelling  . Sulfonamide Derivatives Nausea Only     Outpatient Prescriptions Prior to Visit  Medication Sig Dispense Refill  . albuterol (PROVENTIL HFA;VENTOLIN HFA) 108 (90 BASE) MCG/ACT inhaler Inhale 2 puffs into the lungs every 4 (four) hours as needed. For shortness of breath and wheezing       . hydrochlorothiazide (HYDRODIURIL) 25 MG tablet Take 25 mg by mouth daily.        Marland Kitchen levothyroxine (SYNTHROID, LEVOTHROID) 112 MCG tablet Take 112  mcg by mouth daily.        Marland Kitchen omeprazole (PRILOSEC) 20 MG capsule Take 1 capsule (20 mg total) by mouth daily.  30 capsule  4  . rosuvastatin (CRESTOR) 10 MG tablet Take 10 mg by mouth 3 (three) times a week.        . sertraline (ZOLOFT) 25 MG tablet Take 25 mg by mouth daily.      . benzonatate (TESSALON) 100 MG capsule Take 1-2 every 6 hours as needed per cough protocol  90 capsule  6  . predniSONE (DELTASONE) 10 MG tablet Take 4 tablets x 3 days, 3 tablets x 3 days, 2 tablets x 3 days, 1 tablet x 3 days then stop  30 tablet  0      Review of Systems Constitutional:   No  weight loss, night sweats,  Fevers, chills, fatigue, lassitude. HEENT:   No headaches,  Difficulty swallowing,  Tooth/dental problems,  Sore throat,                No sneezing, itching, ear ache, nasal congestion, post nasal drip,   CV:  No chest pain,  Orthopnea, PND, swelling in lower extremities, anasarca, dizziness, palpitations  GI  No heartburn, indigestion, abdominal pain, nausea, vomiting, diarrhea, change in bowel habits, loss of appetite  Resp: Notes  shortness of breath with exertion or at rest.  No excess mucus, no productive cough,  Notes  non-productive cough,  No coughing up of blood.  No change in color of mucus.  No wheezing.  No chest wall deformity  Skin: no rash or lesions.  GU: no dysuria, change in color of urine, no urgency or frequency.  No flank pain.  MS:  No joint pain or swelling.  No decreased range of motion.  No back pain.  Psych:  No change in mood or affect. No depression or anxiety.  No memory loss.     Objective:   Physical Exam  Filed Vitals:   01/24/12 1119  BP: 122/78  Pulse: 81  Temp: 97.5 F (36.4 C)  TempSrc: Oral  Height: 5' 8.5" (1.74 m)  Weight: 175 lb (79.379 kg)  SpO2: 94%    Gen: Pleasant, well-nourished, in no distress,  normal affect  ENT: No lesions,  mouth clear,  oropharynx clear, no postnasal drip  Neck: No JVD, no TMG, no carotid bruits  Lungs:  No use of accessory muscles, no dullness to percussion, clear without rales or rhonchi  Cardiovascular: RRR, heart sounds normal, no murmur or gallops, no peripheral edema  Abdomen: soft and NT, no HSM,  BS normal  Musculoskeletal: No deformities, no cyanosis or clubbing  Neuro: alert, non focal  Skin: Warm, no  lesions or rashes         Assessment & Plan:   Cough 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 albuterol (PROVENTIL HFA;VENTOLIN HFA) 108 (90 BASE) MCG/ACT inhaler Inhale 2 puffs into the lungs every 4 (four) hours as needed. For shortness of breath and wheezing       . benzonatate (TESSALON) 100 MG capsule Take 1-2 every 6 hours as needed per cough protocol  90 capsule  6  . hydrochlorothiazide (HYDRODIURIL) 25 MG tablet Take 25 mg by mouth daily.        Marland Kitchen levofloxacin (LEVAQUIN) 500 MG tablet Take 1 tablet by mouth Daily.      Marland Kitchen levothyroxine (SYNTHROID, LEVOTHROID) 112 MCG tablet Take 112 mcg by mouth daily.        Marland Kitchen omeprazole (PRILOSEC) 20 MG capsule Take 1 capsule (20 mg total) by mouth daily.  30 capsule  4  . rosuvastatin (CRESTOR) 10 MG tablet Take 10 mg by mouth 3 (three) times a week.        . sertraline (ZOLOFT) 25 MG tablet Take 25 mg by mouth daily.      Marland Kitchen DISCONTD: benzonatate (TESSALON) 100 MG capsule Take 1-2 every 6 hours as needed per cough protocol  90 capsule  6  . chlorpheniramine-HYDROcodone (TUSSIONEX PENNKINETIC ER) 10-8 MG/5ML LQCR Take 5 mLs by mouth every 12 (twelve) hours as needed. For cough  115 mL  0  . fluticasone  (FLONASE) 50 MCG/ACT nasal spray Place 2 sprays into the nose daily.  16 g  6  . predniSONE (DELTASONE) 10 MG tablet Take 4 for three days 3 for three days 2 for three days 1 for three days and stop  30 tablet  0  . DISCONTD: predniSONE (DELTASONE) 10 MG tablet Take 4 tablets x 3 days, 3 tablets x 3 days, 2 tablets x 3 days, 1 tablet x 3 days then stop  30 tablet  0

## 2012-01-25 ENCOUNTER — Ambulatory Visit: Payer: Medicare Other | Admitting: Adult Health

## 2012-01-25 ENCOUNTER — Telehealth: Payer: Self-pay | Admitting: Critical Care Medicine

## 2012-01-25 LAB — ALLERGY FULL PROFILE
Allergen,Goose feathers, e70: <0.1 kU/L (ref <0.35)
Bermuda Grass: <0.1 kU/L (ref <0.35)
Box Elder IgE: <0.1 kU/L (ref <0.35)
Candida Albicans: <0.1 kU/L (ref <0.35)
Fescue: <0.1 kU/L (ref <0.35)
G005 Rye, Perennial: <0.1 kU/L (ref <0.35)
Goldenrod: <0.1 kU/L (ref <0.35)
Helminthosporium halodes: <0.1 kU/L (ref <0.35)
IgE (Immunoglobulin E), Serum: 8.5 IU/mL (ref 0.0–180.0)
Oak: <0.1 kU/L (ref <0.35)
Stemphylium Botryosum: <0.1 kU/L (ref <0.35)
Timothy Grass: <0.1 kU/L (ref <0.35)

## 2012-01-25 MED ORDER — GUAIFENESIN-CODEINE 100-10 MG/5ML PO SYRP: 5.0000 mL | ORAL_SOLUTION | ORAL | Status: AC | PRN

## 2012-01-25 NOTE — Telephone Encounter (Signed)
5ml po q4h prn #180 ml

## 2012-01-25 NOTE — Telephone Encounter (Signed)
Returning call can be reached at [phone].[name] [name]

## 2012-01-25 NOTE — Telephone Encounter (Signed)
ATC pt but had bad cell reception. When i tried to call back NA. wcb

## 2012-01-25 NOTE — Telephone Encounter (Signed)
Please advise quantity and directions thanks

## 2012-01-25 NOTE — Telephone Encounter (Signed)
Pt notified rx for robitussin called in. States nothing further needed.

## 2012-01-25 NOTE — Telephone Encounter (Signed)
[id number] x1 

## 2012-01-25 NOTE — Telephone Encounter (Signed)
I spoke with pt and she states the tussionex was $80. Pt states robitussin w/ codeine is less expensive and it's only $10. Pt is requesting an rx for this for her. Please advise Dr. Delford Field, thanks  Allergies  Allergen Reactions  . Prednisone     Face turns red  . Shellfish Allergy Swelling  . Sulfonamide Derivatives Nausea Only

## 2012-01-25 NOTE — Telephone Encounter (Signed)
This is fine with me   

## 2012-01-25 NOTE — Telephone Encounter (Signed)
Pt returned triage's call.  [name] [name] ° °

## 2012-01-26 ENCOUNTER — Encounter: Payer: Medicare Other | Admitting: Physical Therapy

## 2012-01-26 NOTE — Progress Notes (Signed)
Quick Note:  Call pt and tell her labs are ok, no allergies seen on blood test.  No change in medications ______

## 2012-01-26 NOTE — Progress Notes (Signed)
Quick Note:  Called, spoke with pt. I informed her labs are ok; no allergies seen on blood test per Dr. Delford Field. Advised he recs no change in meds at this time. She verbalized understanding of these results and recs and voiced no further questions/concerns at this time. ______

## 2012-01-31 ENCOUNTER — Encounter: Payer: Medicare Other | Admitting: Physical Therapy

## 2012-02-02 ENCOUNTER — Encounter: Payer: Medicare Other | Admitting: Physical Therapy

## 2012-02-04 ENCOUNTER — Ambulatory Visit: Payer: Medicare Other | Admitting: Emergency Medicine

## 2012-02-07 ENCOUNTER — Encounter: Payer: Medicare Other | Admitting: Physical Therapy

## 2012-02-09 ENCOUNTER — Encounter: Payer: Medicare Other | Admitting: Physical Therapy

## 2012-02-14 ENCOUNTER — Encounter: Payer: Medicare Other | Admitting: Physical Therapy

## 2012-02-16 ENCOUNTER — Encounter: Payer: Medicare Other | Admitting: Physical Therapy

## 2012-10-25 HISTORY — PX: ROTATOR CUFF REPAIR: SHX139

## 2012-12-21 ENCOUNTER — Other Ambulatory Visit: Payer: Self-pay | Admitting: Gastroenterology

## 2012-12-26 ENCOUNTER — Ambulatory Visit (HOSPITAL_COMMUNITY): Admission: RE | Admit: 2012-12-26 | Payer: Medicare Other | Source: Ambulatory Visit | Admitting: Gastroenterology

## 2012-12-26 ENCOUNTER — Encounter (HOSPITAL_COMMUNITY): Admission: RE | Payer: Self-pay | Source: Ambulatory Visit

## 2012-12-26 SURGERY — ESOPHAGOGASTRODUODENOSCOPY (EGD) WITH ESOPHAGEAL DILATION
Anesthesia: Moderate Sedation

## 2014-04-04 DIAGNOSIS — R011 Cardiac murmur, unspecified: Secondary | ICD-10-CM | POA: Insufficient documentation

## 2014-04-04 DIAGNOSIS — J9801 Acute bronchospasm: Secondary | ICD-10-CM | POA: Insufficient documentation

## 2014-04-04 DIAGNOSIS — R079 Chest pain, unspecified: Secondary | ICD-10-CM | POA: Insufficient documentation

## 2014-04-04 NOTE — Progress Notes (Signed)
HISTORY OF PRESENT ILLNESS  Tina Black is a [age] y.o. female.  Chest Pain   The history is provided by the patient. This is a recurrent problem. Episode onset: one week. The problem occurs every several days. The pain is associated with normal activity. The pain is present in the left side. The quality of the pain is described as intermittent. The pain does not radiate. Associated symptoms include shortness of breath. Pertinent negatives include no abdominal pain, no back pain, no claudication, no cough, no diaphoresis, no dizziness, no exertional chest pressure, no fever, no headaches, no hemoptysis, no irregular heartbeat, no leg pain, no lower extremity edema, no malaise/fatigue, no nausea, no near-syncope, no numbness, no orthopnea, no palpitations, no PND, no sputum production, no vomiting and no weakness.       Tina Black has felt an uneasiness in her left chest.  She says it is a sudden sharp pain and then also an uneasiness that follows. Once, lasted 15 seconds. Her previous cardiologist Dr. [place]an[place] in [place].  She has had a negative stress test in the past about 2010. and has had some not so great stress tests due to poor exercise tolerance.   Cath 2004 apparently normal.  Echo maybe 2006.      Review of Systems   Constitutional: Negative for fever, malaise/fatigue and diaphoresis.   Respiratory: Positive for shortness of breath. Negative for cough, hemoptysis and sputum production.    Cardiovascular: Positive for chest pain. Negative for palpitations, orthopnea, claudication, PND and near-syncope.   Gastrointestinal: Negative for nausea, vomiting and abdominal pain.   Musculoskeletal: Negative for back pain.   Neurological: Negative for dizziness, weakness, numbness and headaches.       Physical Exam   Constitutional: She appears well-developed and well-nourished.   HENT:   Head: Normocephalic and atraumatic.   Mouth/Throat: Oropharynx is clear and moist.   Eyes: EOM are normal. Pupils are equal,  round, and reactive to light.   Neck: Neck supple.   Cardiovascular: Normal rate and regular rhythm.    Pulmonary/Chest: Effort normal and breath sounds normal.       Slow exhalation.   Musculoskeletal: Normal range of motion.   Neurological: She is alert.   Skin: Skin is warm and dry.   Psychiatric: She has a normal mood and affect. Her behavior is normal.   Nursing note and vitals reviewed.      ASSESSMENT and PLAN    ICD-9-CM    1. Chest pain at rest 786.50 2D ECHO COMPLETE ADULT (TTE) W OR WO CONTR   2. Bronchospasm, acute 519.11 2D ECHO COMPLETE ADULT (TTE) W OR WO CONTR     PR PT-INITIATE SPIROMETRIC RECORDING PHYS/QHP R&I   3. Cardiac murmur, previously undiagnosed 785.2 2D ECHO COMPLETE ADULT (TTE) W OR WO CONTR     PR PT-INITIATE SPIROMETRIC RECORDING PHYS/QHP R&I     reviewed medications and side effects in detail.  Use of inhalers discussed in detail.  Breo sample to try with exercise.

## 2014-04-18 MED ORDER — ALBUTEROL SULFATE 90 MCG/ACTUATION BREATH ACTIVATED POWDER INHALER
90 mcg/actuation | RESPIRATORY_TRACT | Status: AC | PRN
Start: 2014-04-18 — End: ?

## 2014-04-18 NOTE — Patient Instructions (Signed)
The principles to improving muscle strain or other injury to muscle-skeleton system are rest and movement.  Rest is relative;:  Don't put higher forces on the healing tissue than it can tolerate. Sometimes that requires total immobility and protection of the injured area but that is rare. Usually the body part can be used or at least moved and should be.  Use common sense and plan your activities ahead to avoid unexpected forces and re-injury.   Splinting during at risk activity.  Movement is usually critical to healing.  Instinctively, your body heals the parts it uses and may wither or solidify parts not used.    (If the injury involves muscle and it still hurts after 3 days, spasm is likely involved and only stretching will fix this)  Gentle and progressive stretching and/or use thru normal range-of-motion is typical first step, letting pain be your guide.  You may need expert consultation to know whether to ignore and push through pain or to back off.  Commonly some rehabilitation is helpful after initial recovery:  This may involve using the associated muscles and tendons in exercises that can be repeated 20-30 times before fatigue (low force, high repetition) daily or more often.  Finally, dynamic exercises may be helpful: more specialized or complex movements  that return coordination and use of body part to everyday living.    For chronic or repeated pain/injury, it is very important to strengthen the area when it is hurting less to prevent the next occurrence.   Remember that the usual cause of chronic pain is weakness.   A personal trainer or physical therapist may be most helpful to strengthen weak and frail body parts though a book or video may be sufficient.  This is especially true of chronic or repeat low back, neck and shoulder pain.

## 2014-04-18 NOTE — Progress Notes (Signed)
HISTORY OF PRESENT ILLNESS  Tina Black is a 68 y.o. female.  Results  The history is provided by the patient. Associated symptoms include shortness of breath. Pertinent negatives include no chest pain, no abdominal pain and no headaches.       Tina Black is here for a two week follow up on chest pain.  She has had an echocardiogram and a PFT.  She says she is unable to use the inhalers.      Review of Systems   Constitutional: Negative for fever, chills and malaise/fatigue.   Respiratory: Positive for shortness of breath. Negative for cough and wheezing.    Cardiovascular: Negative for chest pain, palpitations and leg swelling.   Gastrointestinal: Negative for nausea, vomiting, abdominal pain, diarrhea and constipation.   Musculoskeletal: Positive for joint pain.   Neurological: Negative for dizziness, tingling, weakness and headaches.       Physical Exam   Constitutional: She appears well-developed and well-nourished.   HENT:   Head: Normocephalic and atraumatic.   Mouth/Throat: Oropharynx is clear and moist.   Eyes: EOM are normal. Pupils are equal, round, and reactive to light.   Neck: Neck supple.   Cardiovascular: Normal rate and regular rhythm.    Pulmonary/Chest: Effort normal and breath sounds normal.   Musculoskeletal: Normal range of motion.        Arms:  Relatively atrophic muscles.  jt space feels sl diminished.  No acute injury.   Neurological: She is alert.   Skin: Skin is warm and dry.   Psychiatric: She has a normal mood and affect. Her behavior is normal.   Nursing note and vitals reviewed.      ASSESSMENT and PLAN    ICD-9-CM    1. 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   2. Wrist pain, right 719.43     near radial styloid.  PTx exercises and splinting discussed.

## 2014-05-30 ENCOUNTER — Encounter: Payer: Self-pay | Admitting: *Deleted

## 2014-07-15 ENCOUNTER — Encounter

## 2014-07-17 MED ORDER — HYDROCHLOROTHIAZIDE 12.5 MG CAP
12.5 mg | ORAL_CAPSULE | Freq: Every day | ORAL | Status: AC
Start: 2014-07-17 — End: ?

## 2014-07-31 DIAGNOSIS — M62838 Other muscle spasm: Secondary | ICD-10-CM | POA: Insufficient documentation

## 2014-08-19 NOTE — Telephone Encounter (Signed)
Please call and schedule an AWV exam.

## 2014-09-05 ENCOUNTER — Ambulatory Visit: Admit: 2014-09-05 | Discharge: 2014-09-05 | Payer: MEDICARE | Attending: Family Medicine | Primary: Family Medicine

## 2014-09-05 DIAGNOSIS — Z23 Encounter for immunization: Secondary | ICD-10-CM

## 2014-09-05 MED ORDER — PNEUMOCOCCAL 13-VAL CONJ VACCINE-DIP CRM (PF) 0.5 ML IM SYRINGE
0.5 mL | Freq: Once | INTRAMUSCULAR | Status: AC
Start: 2014-09-05 — End: 2014-09-05

## 2014-09-05 NOTE — Progress Notes (Signed)
This is a Subsequent Medicare Annual Wellness Visit providing Personalized Prevention Plan Services (PPPS) (Performed 12 months after initial AWV and PPPS )    I have reviewed the patient's medical history in detail and updated the computerized patient record.     History     Past Medical History   Diagnosis Date   ??? Arthritis    ??? Hypercholesterolemia    ??? Lesion of sciatic nerve       Past Surgical History   Procedure Laterality Date   ??? Hx tonsillectomy     ??? Hx shoulder arthroscopy     ??? Hx heart catheterization       Current Outpatient Prescriptions   Medication Sig Dispense Refill   ??? hydrochlorothiazide (MICROZIDE) 12.5 mg capsule Take 1 Cap by mouth daily. 90 Cap 1   ??? albuterol sulfate (PROAIR RESPICLICK) 90 mcg/actuation aepb Take 1-2 Puffs by inhalation every three (3) hours as needed. 1 Inhaler 1   ??? levothyroxine (SYNTHROID) 100 mcg tablet Take  by mouth Daily (before breakfast).     ??? sertraline (ZOLOFT) 25 mg tablet Take  by mouth daily.     ??? Cholecalciferol, Vitamin D3, (VITAMIN D3) 1,000 unit cap Take  by mouth.     ??? rosuvastatin (CRESTOR) 5 mg tablet Take  by mouth nightly.       Allergies   Allergen Reactions   ??? Shellfish Derived Anaphylaxis   ??? Sulfur Nausea Only     Family History   Problem Relation Age of Onset   ??? No Known Problems Mother    ??? No Known Problems Father      History   Substance Use Topics   ??? Smoking status: Not on file   ??? Smokeless tobacco: Never Used   ??? Alcohol Use: 2.0 oz/week     4 Glasses of wine per week     Patient Active Problem List   Diagnosis Code   ??? Lesion of sciatic nerve G57.00   ??? Chest pain at rest R07.9   ??? Bronchospasm, acute J98.01   ??? Cardiac murmur, previously undiagnosed R01.0       Depression Risk Factor Screening:     PHQ 2 / 9, over the last two weeks 09/05/2014   Little interest or pleasure in doing things Not at all   Feeling down, depressed or hopeless Not at all   Total Score PHQ 2 0     Alcohol Risk Factor Screening:   ok       Functional Ability and Level of Safety:     Hearing Loss   mild    Activities of Daily Living   Self-care.   Requires assistance with: no ADLs    Fall Risk     Fall Risk Assessment, last 12 mths 09/05/2014   Able to walk? Yes   Fall in past 12 months? No     Abuse Screen   None  Patient is not abused    Review of Systems   Not required    Physical Examination     Evaluation of Cognitive Function:  Mood/affect:  happy  Appearance: age appropriate  Family member/caregiver input: none    No exam performed today, not needed..    Patient Care Team:  Alexandria Lodgeick J Najla Aughenbaugh, MD as PCP - [facility])    Advice/Referrals/Counseling   Education and counseling provided:  Are appropriate based on today's review and evaluation    Assessment/Plan   Wellness exam done.  prevnar indicated but not high priority

## 2016-03-12 DIAGNOSIS — M7072 Other bursitis of hip, left hip: Secondary | ICD-10-CM | POA: Insufficient documentation

## 2016-04-12 DIAGNOSIS — H9193 Unspecified hearing loss, bilateral: Secondary | ICD-10-CM | POA: Insufficient documentation

## 2016-04-12 DIAGNOSIS — M1990 Unspecified osteoarthritis, unspecified site: Secondary | ICD-10-CM | POA: Insufficient documentation

## 2016-04-12 DIAGNOSIS — M7540 Impingement syndrome of unspecified shoulder: Secondary | ICD-10-CM | POA: Insufficient documentation

## 2017-02-03 ENCOUNTER — Encounter: Attending: Neurological Surgery | Primary: Family Medicine

## 2017-02-08 ENCOUNTER — Ambulatory Visit: Admit: 2017-02-08 | Discharge: 2017-02-08 | Payer: MEDICARE | Attending: Neurological Surgery | Primary: Family Medicine

## 2017-02-08 DIAGNOSIS — M4126 Other idiopathic scoliosis, lumbar region: Secondary | ICD-10-CM | POA: Insufficient documentation

## 2017-02-08 DIAGNOSIS — M5136 Other intervertebral disc degeneration, lumbar region: Secondary | ICD-10-CM | POA: Insufficient documentation

## 2017-02-08 NOTE — Progress Notes (Signed)
History of Present Illness    Patient Words: 71 y.o.     This patient is a 71 y.o. female who presents today for evaluation of a 2-3 year history of low back pain with worsening pain standing and arising from the seated position.  She has tried various back braces, chiropractic treatment, months of physical therapy, and multiple injections all of which have provided only temporary relief.  No significant sciatica.  Pain is constant.  MRI scanning does reveal widespread degenerative disc disease and scoliosis in the lower lumbar spine.  Films reviewed today.    Past Medical History:   Diagnosis Date   ??? Arthritis    ??? Hypercholesterolemia    ??? Lesion of sciatic nerve    ??? Thyroid disease         Allergies   Allergen Reactions   ??? Shellfish Derived Anaphylaxis   ??? Sulfur Nausea Only        Family History   Problem Relation Age of Onset   ??? Arthritis-osteo Mother    ??? Lung Disease Mother    ??? Heart Disease Father         Social History     Social History   ??? Marital status: DIVORCED     Spouse name: N/A   ??? Number of children: N/A   ??? Years of education: N/A     Occupational History   ??? Not on file.     Social History Main Topics   ??? Smoking status: Never Smoker   ??? Smokeless tobacco: Never Used   ??? Alcohol use 2.0 oz/week     4 Glasses of wine per week   ??? Drug use: No   ??? Sexual activity: Not on file     Other Topics Concern   ??? Not on file     Social History Narrative       Current Outpatient Prescriptions   Medication Sig Dispense Refill   ??? aspirin delayed-release 81 mg tablet Take  by mouth daily.     ??? hydrochlorothiazide (MICROZIDE) 12.5 mg capsule Take 1 Cap by mouth daily. 90 Cap 1   ??? levothyroxine (SYNTHROID) 100 mcg tablet Take  by mouth Daily (before breakfast).     ??? sertraline (ZOLOFT) 25 mg tablet Take  by mouth daily.     ??? Cholecalciferol, Vitamin D3, (VITAMIN D3) 1,000 unit cap Take  by mouth.     ??? rosuvastatin (CRESTOR) 5 mg tablet Take  by mouth nightly.      ??? albuterol sulfate (PROAIR RESPICLICK) 90 mcg/actuation aepb Take 1-2 Puffs by inhalation every three (3) hours as needed. 1 Inhaler 1       Patient Active Problem List   Diagnosis Code   ??? Lesion of sciatic nerve G57.00   ??? Chest pain at rest R07.9   ??? Bronchospasm, acute J98.01   ??? Cardiac murmur, previously undiagnosed R01.1   ??? 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 DDD (degenerative disc disease), lumbar M51.36        Review of Systems: A complete ROS was done and as stated in the HPI or otherwise negative.    Vitals:    02/08/17 1344   BP: 120/80   Temp: 97.7 ??F (36.5 ??C)   Weight: 178 lb (80.7 kg)   Height:  (1.727 m)   PainSc:   6         Physical Exam  The physical exam findings are as follows:  Cranial Nerves:   Intact visual fields. Fundi are benign. PERLA, EOM's full, no nystagmus, no ptosis. Facial sensation is normal. Corneal reflexes are intact. Facial movement is symmetric. Hearing is normal bilaterally. Palate is midline with normal sternocleidomastoid and trapezius muscles are normal. Tongue is midline.   Motor:  5/5 strength in upper and lower proximal and distal muscles. Normal bulk and tone. No fasciculations.   Reflexes:   Deep tendon reflexes 2+/4 and symmetrical.   Sensory:   Normal to touch, pinprick and vibration.   Gait:  Normal gait.   Tremor:   No tremor noted.   Cerebellar:  No cerebellar signs present.               Assessment & Plan      ICD-10-CM ICD-9-CM    1. 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 DDD (degenerative disc disease), lumbar M51.36 722.52      She has exhausted conservative treatment and we have discussed surgical alternatives.  A major reconstructive spine surgery is one option however given her age I feel that it would be difficult to recover with a higher than normal complication rate.    Spinal cord stimulation trial is very reasonable and might provide her  lasting relief.  She will explore this option with her pain management physician.  Return visit here as needed.    Harvie Bridge, MD

## 2017-02-08 NOTE — Addendum Note (Signed)
Addended by: Salley Scarlet on: 02/08/2017 04:16 PM      Modules accepted: Orders

## 2017-03-17 DIAGNOSIS — M461 Sacroiliitis, not elsewhere classified: Secondary | ICD-10-CM | POA: Insufficient documentation

## 2017-03-17 DIAGNOSIS — M48061 Spinal stenosis, lumbar region without neurogenic claudication: Secondary | ICD-10-CM | POA: Insufficient documentation

## 2017-05-23 DIAGNOSIS — R42 Dizziness and giddiness: Secondary | ICD-10-CM | POA: Insufficient documentation

## 2017-10-10 DIAGNOSIS — R1032 Left lower quadrant pain: Secondary | ICD-10-CM | POA: Insufficient documentation

## 2017-10-18 DIAGNOSIS — R109 Unspecified abdominal pain: Secondary | ICD-10-CM | POA: Insufficient documentation

## 2018-12-05 DIAGNOSIS — F329 Major depressive disorder, single episode, unspecified: Secondary | ICD-10-CM | POA: Insufficient documentation

## 2019-05-29 DIAGNOSIS — I480 Paroxysmal atrial fibrillation: Secondary | ICD-10-CM | POA: Insufficient documentation

## 2019-06-06 DIAGNOSIS — N393 Stress incontinence (female) (male): Secondary | ICD-10-CM | POA: Insufficient documentation

## 2019-12-18 DIAGNOSIS — Z01811 Encounter for preprocedural respiratory examination: Secondary | ICD-10-CM | POA: Insufficient documentation

## 2019-12-18 DIAGNOSIS — Z0000 Encounter for general adult medical examination without abnormal findings: Secondary | ICD-10-CM | POA: Insufficient documentation

## 2020-01-22 DIAGNOSIS — R112 Nausea with vomiting, unspecified: Secondary | ICD-10-CM | POA: Insufficient documentation

## 2020-01-22 DIAGNOSIS — R937 Abnormal findings on diagnostic imaging of other parts of musculoskeletal system: Secondary | ICD-10-CM | POA: Insufficient documentation

## 2020-03-07 DIAGNOSIS — H9209 Otalgia, unspecified ear: Secondary | ICD-10-CM | POA: Insufficient documentation

## 2020-03-07 DIAGNOSIS — K529 Noninfective gastroenteritis and colitis, unspecified: Secondary | ICD-10-CM | POA: Insufficient documentation

## 2020-03-07 DIAGNOSIS — R197 Diarrhea, unspecified: Secondary | ICD-10-CM | POA: Insufficient documentation

## 2020-03-26 DIAGNOSIS — W19XXXA Unspecified fall, initial encounter: Secondary | ICD-10-CM | POA: Insufficient documentation

## 2020-07-15 ENCOUNTER — Ambulatory Visit: Payer: Self-pay

## 2020-07-22 DIAGNOSIS — N189 Chronic kidney disease, unspecified: Secondary | ICD-10-CM | POA: Insufficient documentation

## 2020-07-22 DIAGNOSIS — E785 Hyperlipidemia, unspecified: Secondary | ICD-10-CM | POA: Insufficient documentation

## 2020-07-22 DIAGNOSIS — E039 Hypothyroidism, unspecified: Secondary | ICD-10-CM | POA: Insufficient documentation

## 2020-07-22 NOTE — Progress Notes (Signed)
Cardiology Office Note:    Date:  07/23/2020   ID:  [name] [name], DOB Mar 14, 1946, MRN [id number]  PCP:  Daisy Floro, MD  Cardiologist:  Norman Herrlich, MD   Referring MD: Daisy Floro, MD  ASSESSMENT:    1. Angina pectoris (HCC)   2. Paroxysmal atrial fibrillation (HCC)   3. Dyslipidemia    PLAN:    In order of problems listed above:  1. She has a pattern of angina with emotional stress and normal myocardial perfusion study.  We discussed further evaluation she has very mild CKD stage II to undergo cardiac CTA to define the presence or absence of CAD severity and guide treatment.  In the interim we will give her a new prescription for nitroglycerin.  If her calcium score is greater than 10 or 75th percentile will initiate lipid-lowering therapy and if she has severe flow-limiting stenosis would benefit from consideration of revascularization.  I will see her back in the office in 6 weeks. 2. Stable single episode brief no recurrence at this time I would not anticoagulate her initiate antiarrhythmic drug therapy 3. Await results of cardiac CTA likely need lipid-lowering treatment  Next appointment 6 weeks   Medication Adjustments/Labs and Tests Ordered: Current medicines are reviewed at length with the patient today.  Concerns regarding medicines are outlined above.  No orders of the defined types were placed in this encounter.  No orders of the defined types were placed in this encounter.    No chief complaint on file.   History of Present Illness:    [name] [name] is a 74 y.o. female who is being seen today for the evaluation of atrial fibrillation at the request of Daisy Floro, MD. Records of Dr. Tenny Craw say that she has been seen in the past by Dr. Harless Litten for atrial fibrillation I cannot review those records within care everywhere or epic. Testing in epic very rare is detailed below  she was seen by Dr. Virgel Paling in [facility]  for chest pain 04/26/2019. He ordered the echocardiogram and the myocardial perfusion test.   Echo 05/29/2019: Left ventricle systolic function is normal. The ejection fraction is  estimated at 55-60%.   Grade I (mild) left ventricular diastolic dysfunction.   No significant valvular abnormalities noted.  Myocardial perfusion 05/02/2019:  Normal myocardial perfusion.   No scar or evidence of significant inducible ischemia.   Normal LV systolic function.   Chart review shows that she underwent coronary angiography November 2004 which was normal coronary arteriography and ventriculography.  Laboratory test from her primary care physician 07/09/2020 CMP shows a creatinine of 1.04 potassium 4.2 normal TSH remainder of CMP was normal, cholesterol 218 LDL 140 triglycerides 175 HDL 47  She has a long history of intermittent episodes of typical angina.  She experiences substernal pressure no radiation and occurs with stress relieved with rest he is to have a prescription for nitroglycerin many years ago.  Recently she has been under increased stress with the move to [place] and to her new home and had an episode that was particularly bothersome and longer lasting.  Since then she has felt as if she would have episodes but is not quite occurred.  She is limited with back pain but is still an active woman golfs and has no exertional chest pain shortness of breath palpitation or syncope.  She has had no recurrence of her atrial fibrillation.  In general she feels well.  She is not  on lipid-lowering therapy.  No history of congenital rheumatic heart disease Past Medical History:  Diagnosis Date  . Hyperlipemia   . Renal disorder   . Thyroid disease     Past Surgical History:  Procedure Laterality Date  . ROTATOR CUFF REPAIR Right 2014  . TONSILLECTOMY      Current Medications: Current Meds  Medication Sig  . chlorpheniramine-HYDROcodone (TUSSIONEX PENNKINETIC ER) 10-8 MG/5ML LQCR Take 5  mLs by mouth every 12 (twelve) hours as needed. For cough  . levothyroxine (SYNTHROID) 88 MCG tablet Take 88 mcg by mouth daily.   . rosuvastatin (CRESTOR) 5 MG tablet Take 5 mg by mouth 3 (three) times a week.  . sertraline (ZOLOFT) 50 MG tablet Take 50 mg by mouth daily.   . [DISCONTINUED] rosuvastatin (CRESTOR) 10 MG tablet Take 10 mg by mouth 3 (three) times a week.       Allergies:   Prednisone, Shellfish allergy, and Sulfonamide derivatives   Social History   Socioeconomic History  . Marital status: Married    Spouse name: Not on file  . Number of children: Not on file  . Years of education: Not on file  . Highest education level: Not on file  Occupational History  . Not on file  Tobacco Use  . Smoking status: Never Smoker  . Smokeless tobacco: Never Used  . Tobacco comment: passive cigarette smoke exposure  Substance and Sexual Activity  . Alcohol use: Yes    Comment: occasional  . Drug use: No  . Sexual activity: Not on file  Other Topics Concern  . Not on file  Social History Narrative  . Not on file   Social Determinants of Health   Financial Resource Strain:   . Difficulty of Paying Living Expenses: Not on file  Food Insecurity:   . Worried About Programme researcher, broadcasting/film/video in the Last Year: Not on file  . Ran Out of Food in the Last Year: Not on file  Transportation Needs:   . Lack of Transportation (Medical): Not on file  . Lack of Transportation (Non-Medical): Not on file  Physical Activity:   . Days of Exercise per Week: Not on file  . Minutes of Exercise per Session: Not on file  Stress:   . Feeling of Stress : Not on file  Social Connections:   . Frequency of Communication with Friends and Family: Not on file  . Frequency of Social Gatherings with Friends and Family: Not on file  . Attends Religious Services: Not on file  . Active Member of Clubs or Organizations: Not on file  . Attends Banker Meetings: Not on file  . Marital Status: Not on  file     Family History: The patient's family history includes Emphysema in her mother; Heart disease in her father; Rheum arthritis in her maternal grandmother.  ROS:   ROS Please see the history of present illness.     All other systems reviewed and are negative.  EKGs/Labs/Other Studies Reviewed:    The following studies were reviewed today:   EKG:  EKG is  ordered today.  The ekg ordered today is personally reviewed and demonstrates sinus rhythm minor nonspecific T waves otherwise normal   Physical Exam:    VS:  BP 114/60   Pulse 91   Ht 5\' 8"  (1.727 m)   Wt 178 lb (80.7 kg)   SpO2 98%   BMI 27.06 kg/m     Wt Readings from Last 3 Encounters:  07/23/20 178 lb (80.7 kg)  01/24/12 175 lb (79.4 kg)  11/15/11 180 lb (81.6 kg)     GEN:  Well nourished, well developed in no acute distress HEENT: Normal NECK: No JVD; No carotid bruits LYMPHATICS: No lymphadenopathy CARDIAC: RRR, no murmurs, rubs, gallops RESPIRATORY:  Clear to auscultation without rales, wheezing or rhonchi  ABDOMEN: Soft, non-tender, non-distended MUSCULOSKELETAL:  No edema; No deformity  SKIN: Warm and dry NEUROLOGIC:  Alert and oriented x 3 PSYCHIATRIC:  Normal affect     Signed, Norman Herrlich, MD  07/23/2020 2:28 PM    [facility]

## 2020-07-23 ENCOUNTER — Encounter: Payer: Self-pay | Admitting: Cardiology

## 2020-07-23 ENCOUNTER — Other Ambulatory Visit: Payer: Self-pay

## 2020-07-23 ENCOUNTER — Ambulatory Visit (INDEPENDENT_AMBULATORY_CARE_PROVIDER_SITE_OTHER): Payer: Medicare Other | Admitting: Cardiology

## 2020-07-23 VITALS — BP 114/60 | HR 91 | Ht 68.0 in | Wt 178.0 lb

## 2020-07-23 DIAGNOSIS — I480 Paroxysmal atrial fibrillation: Secondary | ICD-10-CM | POA: Diagnosis not present

## 2020-07-23 DIAGNOSIS — E785 Hyperlipidemia, unspecified: Secondary | ICD-10-CM | POA: Diagnosis not present

## 2020-07-23 DIAGNOSIS — I209 Angina pectoris, unspecified: Secondary | ICD-10-CM | POA: Diagnosis not present

## 2020-07-23 MED ORDER — METOPROLOL TARTRATE 50 MG PO TABS: 50.0000 mg | ORAL_TABLET | Freq: Once | ORAL | 0 refills | Status: DC

## 2020-07-23 MED ORDER — NITROGLYCERIN 0.4 MG SL SUBL: 0.4000 mg | SUBLINGUAL_TABLET | SUBLINGUAL | 3 refills | Status: DC | PRN

## 2020-07-23 NOTE — Patient Instructions (Addendum)
Medication Instructions:  Your physician has recommended you make the following change in your medication:  START: Nitroglycerin 0.4 mg take one tablet by mouth every 5 minutes as needed for chest pain *If you need a refill on your cardiac medications before your next appointment, please call your pharmacy*   Lab Work: None If you have labs (blood work) drawn today and your tests are completely normal, you will receive your results only by: Marland Kitchen MyChart Message (if you have MyChart) OR . A paper copy in the mail If you have any lab test that is abnormal or we need to change your treatment, we will call you to review the results.   Testing/Procedures: Your cardiac CT will be scheduled at the below location:   [facility] [address] [place], [place] [address] [phone]   If scheduled at [facility], please arrive at the [facility] main entrance of [facility] 30 minutes prior to test start time. Proceed to the [facility] Radiology Department (first floor) to check-in and test prep.   Please follow these instructions carefully (unless otherwise directed):   On the Night Before the Test: . Be sure to Drink plenty of water. . Do not consume any caffeinated/decaffeinated beverages or chocolate 12 hours prior to your test. . Do not take any antihistamines 12 hours prior to your test.  On the Day of the Test: . Drink plenty of water. Do not drink any water within one hour of the test. . Do not eat any food 4 hours prior to the test. . You may take your regular medications prior to the test.  . Take metoprolol (Lopressor) two hours prior to test. . FEMALES- please wear underwire-free bra if available        After the Test: . Drink plenty of water. . After receiving IV contrast, you may experience a mild flushed feeling. This is normal. . On occasion, you may experience a mild rash up to 24 hours after the test. This is not dangerous. If this  occurs, you can take Benadryl 25 mg and increase your fluid intake. . If you experience trouble breathing, this can be serious. If it is severe call 911 IMMEDIATELY. If it is mild, please call our office. . If you take any of these medications: Glipizide/Metformin, Avandament, Glucavance, please do not take 48 hours after completing test unless otherwise instructed.   Once we have confirmed authorization from your insurance company, we will call you to set up a date and time for your test. Based on how quickly your insurance processes prior authorizations requests, please allow up to 4 weeks to be contacted for scheduling your Cardiac CT appointment. Be advised that routine Cardiac CT appointments could be scheduled as many as 8 weeks after your provider has ordered it.  For non-scheduling related questions, please contact the cardiac imaging nurse navigator should you have any questions/concerns: Marchia Bond, Cardiac Imaging Nurse Navigator Burley Saver, Interim Cardiac Imaging Nurse [place] [facility] Direct Office Dial: [phone]   For scheduling needs, including cancellations and rescheduling, please call Vivien Rota at [phone], option 3.      Follow-Up: At [facility], you and your health needs are our priority.  As part of our continuing mission to provide you with exceptional heart care, we have created designated Provider Care Teams.  These Care Teams include your primary Cardiologist (physician) and Advanced Practice Providers (APPs -  Physician Assistants and Nurse Practitioners) who  all work together to provide you with the care you need, when you need it.  We recommend signing up for the patient portal called "MyChart".  Sign up information is provided on this After Visit Summary.  MyChart is used to connect with patients for Virtual Visits (Telemedicine).  Patients are able to view lab/test results, encounter notes, upcoming appointments, etc.   Non-urgent messages can be sent to your provider as well.   To learn more about what you can do with MyChart, go to [email or web address].    Your next appointment:   6 week(s)  The format for your next appointment:   In Person  Provider:   Shirlee More, MD   Other Instructions

## 2020-07-29 ENCOUNTER — Ambulatory Visit: Payer: Self-pay

## 2020-08-08 ENCOUNTER — Other Ambulatory Visit: Payer: Self-pay

## 2020-08-08 DIAGNOSIS — N289 Disorder of kidney and ureter, unspecified: Secondary | ICD-10-CM

## 2020-08-14 ENCOUNTER — Other Ambulatory Visit: Payer: Self-pay

## 2020-08-14 ENCOUNTER — Telehealth (HOSPITAL_COMMUNITY): Payer: Self-pay | Admitting: Emergency Medicine

## 2020-08-14 DIAGNOSIS — N289 Disorder of kidney and ureter, unspecified: Secondary | ICD-10-CM

## 2020-08-14 NOTE — Telephone Encounter (Signed)
Reaching out to patient to offer assistance regarding upcoming cardiac imaging study; pt verbalizes understanding of appt date/time, parking situation and where to check in, pre-test NPO status and medications ordered, and verified current allergies; name and call back number provided for further questions should they arise Rockwell Alexandria RN Navigator Cardiac Imaging [facility] [phone] office [phone] cell   Pt requesting to have labs drawn at a closer office than the [place] office.

## 2020-08-15 ENCOUNTER — Telehealth: Payer: Self-pay

## 2020-08-15 LAB — BASIC METABOLIC PANEL
BUN/Creatinine Ratio: 12 (ref 12–28)
BUN: 13 mg/dL (ref 8–27)
CO2: 25 mmol/L (ref 20–29)
Calcium: 9.5 mg/dL (ref 8.7–10.3)
Chloride: 105 mmol/L (ref 96–106)
Creatinine, Ser: 1.12 mg/dL — ABNORMAL HIGH (ref 0.57–1.00)
GFR calc Af Amer: 55 mL/min/{1.73_m2} — ABNORMAL LOW (ref >59)
GFR calc non Af Amer: 48 mL/min/{1.73_m2} — ABNORMAL LOW (ref >59)
Glucose: 100 mg/dL — ABNORMAL HIGH (ref 65–99)
Potassium: 4.2 mmol/L (ref 3.5–5.2)
Sodium: 141 mmol/L (ref 134–144)

## 2020-08-15 NOTE — Telephone Encounter (Signed)
-----   Message from Baldo Daub, MD sent at 08/15/2020  8:02 AM EDT ----- Normal

## 2020-08-15 NOTE — Telephone Encounter (Signed)
Spoke with patient regarding results and recommendation.  Patient verbalizes understanding and is agreeable to plan of care. Advised patient to call back with any issues or concerns.  

## 2020-08-19 ENCOUNTER — Telehealth (HOSPITAL_COMMUNITY): Payer: Self-pay | Admitting: *Deleted

## 2020-08-19 NOTE — Telephone Encounter (Signed)
Attempted to call patient regarding upcoming cardiac CT appointment. Left message on voicemail with name and callback number  Lugene Hitt Tai RN Navigator Cardiac Imaging [facility] [phone] Office [phone] Cell  

## 2020-08-20 ENCOUNTER — Other Ambulatory Visit: Payer: Self-pay

## 2020-08-20 ENCOUNTER — Telehealth: Payer: Self-pay | Admitting: Cardiology

## 2020-08-20 ENCOUNTER — Ambulatory Visit (HOSPITAL_COMMUNITY)
Admission: RE | Admit: 2020-08-20 | Discharge: 2020-08-20 | Disposition: A | Discharge status: Home / Self Care | Payer: Medicare Other | Source: Ambulatory Visit | Attending: Cardiology | Admitting: Cardiology

## 2020-08-20 DIAGNOSIS — I209 Angina pectoris, unspecified: Secondary | ICD-10-CM

## 2020-08-20 MED ORDER — NITROGLYCERIN 0.4 MG SL SUBL
0.8000 mg | SUBLINGUAL_TABLET | Freq: Once | SUBLINGUAL | Status: AC
  Administered 2020-08-20: 0.8 mg via SUBLINGUAL

## 2020-08-20 MED ORDER — NITROGLYCERIN 0.4 MG SL SUBL
SUBLINGUAL_TABLET | SUBLINGUAL | Status: AC
  Filled 2020-08-20: qty 2

## 2020-08-20 MED ORDER — IOHEXOL 350 MG/ML SOLN
75.0000 mL | Freq: Once | INTRAVENOUS | Status: AC | PRN
  Administered 2020-08-20: 75 mL via INTRAVENOUS

## 2020-08-20 NOTE — Telephone Encounter (Signed)
Pt advised that her full cardiac CT is not available yet and Dr. Dulce Sellar is still needs to review and we will call her as soon as we hear back from him. Pt verbalized understanding and agreed.

## 2020-08-20 NOTE — Telephone Encounter (Signed)
Patient states she has reviewed her CT results and she would like a call back to discuss a plan of action moving forward. Please call.

## 2020-08-21 ENCOUNTER — Ambulatory Visit (HOSPITAL_COMMUNITY): Admission: RE | Admit: 2020-08-21 | Payer: Medicare Other | Source: Ambulatory Visit

## 2020-08-21 ENCOUNTER — Ambulatory Visit (HOSPITAL_COMMUNITY)
Admission: RE | Admit: 2020-08-21 | Discharge: 2020-08-21 | Disposition: A | Discharge status: Home / Self Care | Payer: Medicare Other | Source: Ambulatory Visit | Attending: Cardiology | Admitting: Cardiology

## 2020-08-21 DIAGNOSIS — I480 Paroxysmal atrial fibrillation: Secondary | ICD-10-CM | POA: Diagnosis not present

## 2020-08-21 DIAGNOSIS — I25119 Atherosclerotic heart disease of native coronary artery with unspecified angina pectoris: Secondary | ICD-10-CM | POA: Diagnosis not present

## 2020-08-21 DIAGNOSIS — I700 Atherosclerosis of aorta: Secondary | ICD-10-CM | POA: Diagnosis not present

## 2020-08-21 DIAGNOSIS — R079 Chest pain, unspecified: Secondary | ICD-10-CM | POA: Diagnosis present

## 2020-08-24 DIAGNOSIS — I209 Angina pectoris, unspecified: Secondary | ICD-10-CM | POA: Diagnosis not present

## 2020-09-11 NOTE — Progress Notes (Signed)
Cardiology Office Note:    Date:  09/12/2020   ID:  [name] [name], DOB 02-26-1946, MRN [id number]  PCP:  Daisy Floro, MD  Cardiologist:  Norman Herrlich, MD    Referring MD: Daisy Floro, MD    ASSESSMENT:    1. Coronary artery disease of native artery of native heart with stable angina pectoris (HCC)   2. Dyslipidemia   3. Vertigo    PLAN:    In order of problems listed above:  1. She has stable angina and nonobstructive CAD.  Treatment initiated aspirin continue statin nitroglycerin as needed at this time I do not think she needs other antianginal therapy. 2. Increase her statin to every day follow-up labs Dr. Tenny Craw 3. I told her to consider PT modalities if ongoing and bothersome   Next appointment: As needed she prefers to follow-up with her PCP and I told her to contact me if she is having frequent angina.   Medication Adjustments/Labs and Tests Ordered: Current medicines are reviewed at length with the patient today.  Concerns regarding medicines are outlined above.  No orders of the defined types were placed in this encounter.  Meds ordered this encounter  Medications  . aspirin EC 81 MG tablet    Sig: Take 1 tablet (81 mg total) by mouth daily. Swallow whole.    Dispense:  90 tablet    Refill:  3    No chief complaint on file.   History of Present Illness:    [name] [name] is a 74 y.o. female with a hx of typical angina pectoris paroxysmal atrial fibrillation and dyslipidemia last seen 07/23/2020. Compliance with diet, lifestyle and medications: Yes  With a nice opportunity reviewed her cardiac CTA findings.  She is reassured understands that she has mild CAD is committed to taking a statin will increase her rosuvastatin to daily with plans for follow-up lipid profile wellness exam Dr. Tenny Craw February.  She has had rare angina emotional trigger relieved with nitroglycerin.  We discussed whether she should take additional medication antianginal  I do not think so especially she is having trouble with vertigo intermittently.  She does not have exertional chest pain blood pressure is at target.  She underwent cardiac CTA reported 08/20/2020.  Her calcium score was 74 53rd percentile for age and sex CAD was present with LAD stenosis 50 to 69% left circumflex 25 to 49% and FFR was normal in both vessels. Past Medical History:  Diagnosis Date  . Hyperlipemia   . Renal disorder   . Thyroid disease     Past Surgical History:  Procedure Laterality Date  . ROTATOR CUFF REPAIR Right 2014  . TONSILLECTOMY      Current Medications: Current Meds  Medication Sig  . levothyroxine (SYNTHROID) 88 MCG tablet Take 88 mcg by mouth daily.   . nitroGLYCERIN (NITROSTAT) 0.4 MG SL tablet Place 1 tablet (0.4 mg total) under the tongue every 5 (five) minutes as needed for chest pain.  . rosuvastatin (CRESTOR) 5 MG tablet Take 5 mg by mouth 3 (three) times a week.  . sertraline (ZOLOFT) 50 MG tablet Take 50 mg by mouth daily.      Allergies:   Prednisone, Shellfish allergy, and Sulfonamide derivatives   Social History   Socioeconomic History  . Marital status: Divorced    Spouse name: Not on file  . Number of children: Not on file  . Years of education: Not on file  . Highest education level: Not on  file  Occupational History  . Not on file  Tobacco Use  . Smoking status: Never Smoker  . Smokeless tobacco: Never Used  . Tobacco comment: passive cigarette smoke exposure  Substance and Sexual Activity  . Alcohol use: Yes    Comment: occasional  . Drug use: No  . Sexual activity: Not on file  Other Topics Concern  . Not on file  Social History Narrative  . Not on file   Social Determinants of Health   Financial Resource Strain:   . Difficulty of Paying Living Expenses: Not on file  Food Insecurity:   . Worried About Programme researcher, broadcasting/film/video in the Last Year: Not on file  . Ran Out of Food in the Last Year: Not on file  Transportation  Needs:   . Lack of Transportation (Medical): Not on file  . Lack of Transportation (Non-Medical): Not on file  Physical Activity:   . Days of Exercise per Week: Not on file  . Minutes of Exercise per Session: Not on file  Stress:   . Feeling of Stress : Not on file  Social Connections:   . Frequency of Communication with Friends and Family: Not on file  . Frequency of Social Gatherings with Friends and Family: Not on file  . Attends Religious Services: Not on file  . Active Member of Clubs or Organizations: Not on file  . Attends Banker Meetings: Not on file  . Marital Status: Not on file     Family History: The patient's family history includes Emphysema in her mother; Heart disease in her father; Rheum arthritis in her maternal grandmother. ROS:   Please see the history of present illness.    All other systems reviewed and are negative.  EKGs/Labs/Other Studies Reviewed:    The following studies were reviewed today:  Recent Labs: 08/14/2020: BUN 13; Creatinine, Ser 1.12; Potassium 4.2; Sodium 141  Recent Lipid Panel From last visit her baseline LDL 140  Physical Exam:    VS:  BP 126/62   Pulse 80   Ht 5\' 8"  (1.727 m)   Wt 176 lb 0.6 oz (79.9 kg)   SpO2 96%   BMI 26.77 kg/m     Wt Readings from Last 3 Encounters:  09/12/20 176 lb 0.6 oz (79.9 kg)  07/23/20 178 lb (80.7 kg)  01/24/12 175 lb (79.4 kg)     GEN:  Well nourished, well developed in no acute distress she has no xanthoma or xanthelasma HEENT: Normal NECK: No JVD; No carotid bruits LYMPHATICS: No lymphadenopathy CARDIAC: RRR, no murmurs, rubs, gallops RESPIRATORY:  Clear to auscultation without rales, wheezing or rhonchi  ABDOMEN: Soft, non-tender, non-distended MUSCULOSKELETAL:  No edema; No deformity  SKIN: Warm and dry NEUROLOGIC:  Alert and oriented x 3 PSYCHIATRIC:  Normal affect    Signed, 03/25/12, MD  09/12/2020 11:23 AM    [facility]

## 2020-09-12 ENCOUNTER — Ambulatory Visit (INDEPENDENT_AMBULATORY_CARE_PROVIDER_SITE_OTHER): Payer: Medicare Other | Admitting: Cardiology

## 2020-09-12 ENCOUNTER — Encounter: Payer: Self-pay | Admitting: Cardiology

## 2020-09-12 ENCOUNTER — Other Ambulatory Visit: Payer: Self-pay

## 2020-09-12 VITALS — BP 126/62 | HR 80 | Ht 68.0 in | Wt 176.0 lb

## 2020-09-12 DIAGNOSIS — I25118 Atherosclerotic heart disease of native coronary artery with other forms of angina pectoris: Secondary | ICD-10-CM

## 2020-09-12 DIAGNOSIS — R42 Dizziness and giddiness: Secondary | ICD-10-CM

## 2020-09-12 DIAGNOSIS — E785 Hyperlipidemia, unspecified: Secondary | ICD-10-CM

## 2020-09-12 MED ORDER — ASPIRIN EC 81 MG PO TBEC: 81.0000 mg | DELAYED_RELEASE_TABLET | Freq: Every day | ORAL | 3 refills | Status: DC

## 2020-09-12 MED ORDER — ROSUVASTATIN CALCIUM 5 MG PO TABS
5.0000 mg | ORAL_TABLET | Freq: Every day | ORAL | 3 refills | Status: DC
Start: 2020-09-12 — End: 2022-03-05

## 2020-09-12 NOTE — Patient Instructions (Signed)
Medication Instructions:  Your physician has recommended you make the following change in your medication:  START: Aspirin 81 mg take one tablet by mouth daily.   *If you need a refill on your cardiac medications before your next appointment, please call your pharmacy*   Lab Work: None If you have labs (blood work) drawn today and your tests are completely normal, you will receive your results only by: Marland Kitchen MyChart Message (if you have MyChart) OR . A paper copy in the mail If you have any lab test that is abnormal or we need to change your treatment, we will call you to review the results.   Testing/Procedures: None   Follow-Up: At [facility], you and your health needs are our priority.  As part of our continuing mission to provide you with exceptional heart care, we have created designated Provider Care Teams.  These Care Teams include your primary Cardiologist (physician) and Advanced Practice Providers (APPs -  Physician Assistants and Nurse Practitioners) who all work together to provide you with the care you need, when you need it.  We recommend signing up for the patient portal called "MyChart".  Sign up information is provided on this After Visit Summary.  MyChart is used to connect with patients for Virtual Visits (Telemedicine).  Patients are able to view lab/test results, encounter notes, upcoming appointments, etc.  Non-urgent messages can be sent to your provider as well.   To learn more about what you can do with MyChart, go to [email or web address].    Your next appointment:   As needed  The format for your next appointment:   In Person  Provider:   Norman Herrlich, MD   Other Instructions

## 2020-09-23 DIAGNOSIS — M47817 Spondylosis without myelopathy or radiculopathy, lumbosacral region: Secondary | ICD-10-CM | POA: Insufficient documentation

## 2020-10-13 ENCOUNTER — Institutional Professional Consult (permissible substitution): Payer: Medicare Other | Admitting: Pulmonary Disease

## 2020-12-08 ENCOUNTER — Telehealth: Payer: Self-pay | Admitting: Pulmonary Disease

## 2020-12-08 ENCOUNTER — Encounter: Payer: Self-pay | Admitting: Pulmonary Disease

## 2020-12-08 ENCOUNTER — Other Ambulatory Visit: Payer: Self-pay

## 2020-12-08 ENCOUNTER — Ambulatory Visit (INDEPENDENT_AMBULATORY_CARE_PROVIDER_SITE_OTHER): Payer: Medicare Other | Admitting: Pulmonary Disease

## 2020-12-08 VITALS — BP 112/76 | HR 78 | Temp 97.0°F | Ht 68.0 in | Wt 177.0 lb

## 2020-12-08 DIAGNOSIS — R053 Chronic cough: Secondary | ICD-10-CM | POA: Diagnosis not present

## 2020-12-08 DIAGNOSIS — J449 Chronic obstructive pulmonary disease, unspecified: Secondary | ICD-10-CM | POA: Diagnosis not present

## 2020-12-08 DIAGNOSIS — I2510 Atherosclerotic heart disease of native coronary artery without angina pectoris: Secondary | ICD-10-CM | POA: Insufficient documentation

## 2020-12-08 MED ORDER — ADVAIR HFA 115-21 MCG/ACT IN AERO: 2.0000 | INHALATION_SPRAY | Freq: Two times a day (BID) | RESPIRATORY_TRACT | 6 refills | Status: DC

## 2020-12-08 NOTE — Progress Notes (Addendum)
Subjective:   PATIENT ID: [name] [name] GENDER: female DOB: 01/21/1946, MRN: [id number]   HPI  Chief Complaint  Patient presents with  . Consult    Dry Cough with exertion.     Reason for Visit: New consult for chronic cough  Ms. Ettel Albergo is a 75 year old female with paroxysmal atrial fibrillation, hypothyroidism, CKD stage III who presents for chronic cough.  She was previously evaluated at [facility] pulmonary by Dr. Delton Coombes and Dr. Delford Field.  Prior notes reviewed with her last visit in April 2013.  Per notes, she had PFTs were overall normal with possible airflow limitation.  She had previously trialed Spiriva however self discontinued due to hoarseness.  Dr. Delford Field had treated her with prednisone and antihistamines and PPI however she was lost to follow-up after moving.  Her recent note with her PCP on 09/05/2020 with Dr. Catha Gosselin states that she has persistent cough the last 5 to 6 weeks, occurring at least twice a year.  She is currently on a prednisone pack for back pain and this has not helped with her cough.  Dr. Clarene Duke has prescribed her Tessalon Perles, Zyrtec and Benadryl and omeprazole 20 mg daily.  Referred to pulmonary for further management.  On this visit, she reports long standing cough for >10 years. Associated with wheezing especially with exertion including when walking upstairs or exercise. Her symptoms were similar when she was in [place] vs [place]. Her symptoms will worsen when having a cough and will be associated with chest congestion. She has tried diet restrictions and PPI without improvement. She usually plays golf once a week but has noticed she will get out of breath easily. She is trying to get more exercise by walking 1/2 miles in her building 1-2 times. She has tried an over the encounter inhaler (?Albuterol) without much relief but doesn't recall the name of it.   Social History: Never smoker Significant secondhand smoke  exposure Previously worked in [employer], assisted in moving in Education officer, community No wood burning stove in [employer] exposures: None  I have personally reviewed patient's past medical/family/social history, allergies, current medications.  Past Medical History:  Diagnosis Date  . High cholesterol   . Hyperlipemia   . Renal disorder   . Thyroid disease      Family History  Problem Relation Age of Onset  . Emphysema Mother   . Heart disease Father   . Rheum arthritis Maternal Grandmother      Social History   Occupational History  . Not on file  Tobacco Use  . Smoking status: Former Smoker    Years: 1.00    Types: Cigarettes    Start date: 1963    Quit date: 1963    Years since quitting: 59.1  . Smokeless tobacco: Never Used  . Tobacco comment: passive cigarette smoke exposure, smoked in high school  Substance and Sexual Activity  . Alcohol use: Yes    Comment: occasional  . Drug use: No  . Sexual activity: Not on file    Allergies  Allergen Reactions  . Prednisone     Face turns red  . Shellfish Allergy Swelling  . Sulfonamide Derivatives Nausea Only     Outpatient Medications Prior to Visit  Medication Sig Dispense Refill  . aspirin EC 81 MG tablet Take 1 tablet (81 mg total) by mouth daily. Swallow whole. 90 tablet 3  . levothyroxine (SYNTHROID) 88 MCG tablet Take 88 mcg by mouth daily.    Marland Kitchen  rosuvastatin (CRESTOR) 5 MG tablet Take 1 tablet (5 mg total) by mouth daily. 90 tablet 3  . sertraline (ZOLOFT) 50 MG tablet Take 50 mg by mouth daily.     . fluticasone (FLONASE) 50 MCG/ACT nasal spray Place 2 sprays into the nose daily. 16 g 6  . nitroGLYCERIN (NITROSTAT) 0.4 MG SL tablet Place 1 tablet (0.4 mg total) under the tongue every 5 (five) minutes as needed for chest pain. 90 tablet 3   No facility-administered medications prior to visit.    Review of Systems  Constitutional: Negative for chills, diaphoresis, fever, malaise/fatigue  and weight loss.  HENT: Negative for congestion, ear pain and sore throat.   Respiratory: Positive for cough, shortness of breath and wheezing. Negative for hemoptysis and sputum production.   Cardiovascular: Negative for chest pain, palpitations and leg swelling.  Gastrointestinal: Negative for abdominal pain, heartburn and nausea.  Genitourinary: Negative for frequency.  Musculoskeletal: Positive for joint pain. Negative for myalgias.  Skin: Negative for itching and rash.  Neurological: Negative for dizziness, weakness and headaches.  Endo/Heme/Allergies: Does not bruise/bleed easily.  Psychiatric/Behavioral: Negative for depression. The patient is not nervous/anxious.      Objective:   Vitals:   12/08/20 1018  BP: 112/76  Pulse: 78  Temp: (!) 97 F (36.1 C)  SpO2: 97%  Weight: 177 lb (80.3 kg)  Height: 5\' 8"  (1.727 m)   SpO2: 97 % O2 Device: None (Room air)  Physical Exam: General: Well-appearing, no acute distress HENT: [place], AT Eyes: EOMI, no scleral icterus Respiratory: Expiratory wheeze that cleared after inspiration. Otherwise clear to auscultation bilaterally.  No crackles or rales Cardiovascular: RRR, -M/R/G, no JVD Extremities:-Edema,-tenderness Neuro: AAO x4, CNII-XII grossly intact Skin: Intact, no rashes or bruising Psych: Normal mood, normal affect  Data Reviewed:  Imaging: CXR 10/22/11 - Mild hyperinflation. No infiltrate, effusion or edema  PFT: 09/21/10 FVC 3.25 (93%) FEV1 2.3 (90%) Ratio 68  TLC 92% DLCO 101% Interpretation: Mild obstructive defect is present. FEV1/FVC ratio <70%.  Labs: CBC No results found for: WBC, RBC, HGB, HCT, PLT, MCV, MCH, MCHC, RDW, LYMPHSABS, MONOABS, EOSABS, BASOSABS  BMET    Component Value Date/Time   NA 141 08/14/2020 1405   K 4.2 08/14/2020 1405   CL 105 08/14/2020 1405   CO2 25 08/14/2020 1405   GLUCOSE 100 (H) 08/14/2020 1405   BUN 13 08/14/2020 1405   CREATININE 1.12 (H) 08/14/2020 1405   CALCIUM 9.5  08/14/2020 1405   GFRNONAA 48 (L) 08/14/2020 1405   GFRAA 55 (L) 08/14/2020 1405   Imaging, labs and tests noted above have been reviewed independently by me.  Assessment & Plan:   Discussion: 75 year old female with paroxysmal atrial fibrillation, hypothyroidism, CKD stage III who presents for chronic cough.  Reviewed PFTs which demonstrated FEV1/FVC less than 70% suggesting obstructive defect.  The only inhaler she has tried in the past is Spiriva however self discontinued due to hoarseness.  We discussed a trial of ICS/LABA inhaler which can potentially provide some benefit for her wheezing and cough.  Recommended using a spacer and rinsing mouth out after each use.  We will plan to have PFTs completed prior to her next visit.  Depending on her response to the bronchodilator, will titrate up versus optimizing management of possible upper airway cough syndrome versus silent reflux.  Mild COPD --START Advair 115-21 mcg TWO puffs TWICE a day. Use your spacer.  --We will arrange Pulmonary Function Tests prior to your next visit  Addendum:  Patient unable to afford current inhaler. Changed inhaler to Wixela 100-50 TWO puffs TWICE a day. Warned patient that this may potentially cause hoarseness.  Health Maintenance Immunization History  Administered Date(s) Administered  . Influenza Split 08/26/2011  . Influenza Whole 09/11/2010  . Influenza,inj,quad, With Preservative 09/24/2020  . PFIZER(Purple Top)SARS-COV-2 Vaccination 11/13/2019, 11/16/2019, 12/07/2019, 12/07/2019   CT Lung Screen - not indicated  Orders Placed This Encounter  Procedures  . Pulmonary function test    Standing Status:   Future    Standing Expiration Date:   12/08/2021    Order Specific Question:   Where should this test be performed?    Answer:   [facility]    Order Specific Question:   Full PFT: includes the following: basic spirometry, spirometry pre & post bronchodilator, diffusion capacity (DLCO), lung  volumes    Answer:   Full PFT   Meds ordered this encounter  Medications  . fluticasone-salmeterol (ADVAIR HFA) 115-21 MCG/ACT inhaler    Sig: Inhale 2 puffs into the lungs 2 (two) times daily.    Dispense:  1 each    Refill:  6    Return in about 2 months (around 02/05/2021).  I have spent a total time of 45-minutes on the day of the appointment reviewing prior documentation, coordinating care and discussing medical diagnosis and plan with the patient/family. Imaging, labs and tests included in this note have been reviewed and interpreted independently by me.  Nelson Julson Mechele Collin, MD [facility] 12/08/2020 10:57 AM  Office Number [phone]

## 2020-12-08 NOTE — Patient Instructions (Addendum)
Mild COPD --START Advair 115-21 mcg TWO puffs TWICE a day. Use your spacer.  --We will arrange Pulmonary Function Tests prior to your next visit  Follow-up with me in 2 months

## 2020-12-08 NOTE — Telephone Encounter (Signed)
Called and spoke with pt who stated the Advair is going to cost her $125 at pharmacy with using GoodRx card. Stated to pt to contact insurance company to have them provide her a formulary list of covered inhalers and stated to her once she had that info to call us back and we could then discuss this with Dr. Everardo All. Pt verbalized understanding. Nothing further needed.

## 2020-12-09 ENCOUNTER — Telehealth: Payer: Self-pay | Admitting: Pulmonary Disease

## 2020-12-09 ENCOUNTER — Encounter: Payer: Self-pay | Admitting: *Deleted

## 2020-12-09 ENCOUNTER — Other Ambulatory Visit: Payer: Self-pay | Admitting: Pulmonary Disease

## 2020-12-09 MED ORDER — FLUTICASONE-SALMETEROL 100-50 MCG/DOSE IN AEPB: 2.0000 | INHALATION_SPRAY | Freq: Two times a day (BID) | RESPIRATORY_TRACT | 6 refills | Status: DC

## 2020-12-09 NOTE — Addendum Note (Signed)
Addended by: Mechele Collin on: 12/09/2020 05:43 PM   Modules accepted: Orders

## 2020-12-09 NOTE — Telephone Encounter (Signed)
was given to the pt to call to start her paper work for patient assistance for the advair via [employer]. I have sent this information via a pt message through mychart per the pts request.

## 2020-12-10 NOTE — Telephone Encounter (Signed)
Called and spoke with pt and she stated that [employer] called her back this morning and said that they can get the medication for 2 months supply from [place] for $59.  It would take 4-8 weeks to get it to her.  She is going to come by the office in the morning so she can give me the information needed.  Will forward to JE to make her aware.

## 2020-12-10 NOTE — Telephone Encounter (Signed)
Noted. Prefer patient to be on:  Advair HFA 115-21 mcg TWO puffs TWICE a day. Use your spacer.

## 2020-12-11 ENCOUNTER — Other Ambulatory Visit: Payer: Self-pay | Admitting: *Deleted

## 2020-12-11 NOTE — Telephone Encounter (Signed)
Pt  Came to the office today to show me the email that she got and where the rx for the advair hFA needs to be faxed to.  [employer] Fax # [phone] with a cover sheet from our office  Pt will get her other information sent in for them to be able to mail her medications out to her.    JE is not in the office so we will get this rx signed by one of the other providers here in the office.   Below is what JE wanted the pt to have:  Advair HFA 115-21 mcg TWO puffs TWICE a day. Use your spacer

## 2020-12-12 MED ORDER — ADVAIR HFA 115-21 MCG/ACT IN AERO: 2.0000 | INHALATION_SPRAY | Freq: Two times a day (BID) | RESPIRATORY_TRACT | 3 refills | Status: DC

## 2020-12-30 NOTE — Telephone Encounter (Signed)
Patient sent email regarding medication  Dr. Everardo All, I was sent SEROFLO 125 inhaler yesterday.  Maker is [employer].  This is not the Advair that was ordered by your office.  I am wondering if this is okay to use.  Package says it was shipped from [place] and if undelivered return to [place] ??? I thought Advair was coming from [place].  I want to make sure this is safe and correct inhaler to use.  I look forward to hearing from you.Marland Kitchen   Thanks, [name] [name]  Will send to Dr. Everardo All for further recommendations.

## 2020-12-30 NOTE — Telephone Encounter (Signed)
Called patient. Not clear to me that this inhaler is FDA approved. I have advised against using it at this time. Discussed alternative options including Symbicort 80-4.60mcg TWO puffs TWICE a day. Patient wishes to do research and will send message if wants [place] to order inhaler. She also asked about Primatine inhaler (epinephrine) which she purchased over the counter. This is a short acting inhaler and would not replace and ICS/LABA. Patient expressed understanding.  No action at this time. Await patient to call for Symbicort if she wishes. Also advised her to obtain formulary list from her insurance.

## 2020-12-31 NOTE — Telephone Encounter (Signed)
Noted.   Appt made.   Will send appt info to patient via email.

## 2020-12-31 NOTE — Telephone Encounter (Signed)
[name] [name],  To summarize what we discussed on the phone: I have personally contacted the FDA and reviewed their drug approval website and unfortunately, SEROFLO is not approved for use in the [place]. Again, I would recommend against using this inhaler at this time. The FDA pharmacist expressed concerns regarding importation of non-FDA approved medication and is available to discuss any information you have regarding the company ([employer]). Her number is [phone] Option 9 Ext [phone].  Regarding management of your symptoms, we can discuss alternative inhalers vs chronic prednisone therapy at our next visit after your pulmonary function tests. Our office staff will be in touch to schedule this appointment once my clinic opens up.  Sincerely, Mechele Collin, MD

## 2021-01-23 DIAGNOSIS — J029 Acute pharyngitis, unspecified: Secondary | ICD-10-CM | POA: Diagnosis not present

## 2021-02-02 DIAGNOSIS — M25511 Pain in right shoulder: Secondary | ICD-10-CM | POA: Insufficient documentation

## 2021-02-18 ENCOUNTER — Ambulatory Visit: Payer: Self-pay | Admitting: Pulmonary Disease

## 2021-02-18 ENCOUNTER — Ambulatory Visit: Payer: Self-pay

## 2021-03-02 ENCOUNTER — Telehealth: Payer: Self-pay | Admitting: Pulmonary Disease

## 2021-03-02 DIAGNOSIS — J441 Chronic obstructive pulmonary disease with (acute) exacerbation: Secondary | ICD-10-CM

## 2021-03-02 MED ORDER — ADVAIR HFA 115-21 MCG/ACT IN AERO: 2.0000 | INHALATION_SPRAY | Freq: Two times a day (BID) | RESPIRATORY_TRACT | 3 refills | Status: DC

## 2021-03-02 MED ORDER — PREDNISONE 10 MG PO TABS: ORAL_TABLET | ORAL | 0 refills | Status: AC

## 2021-03-02 NOTE — Telephone Encounter (Signed)
[facility] Telephone Encounter  Called patient. She has not been taking her ICS/LABA due to cost. She is having shortness of breath and wheezing. She reports that she is able to tolerate prednisone and recently took it 6 weeks ago for shoulder pain.  Assessment/Plan COPD exacerbation --Prednisone taper ordered --Advair 115-21 mcg TWO puffs BID  --Offered to arrange appt however patient states too much is going on right now  Advised patient to contact office follow-up when ready.  Mechele Collin, M.D. [facility] 03/02/2021 4:28 PM   See Amion for personal pager For hours between 7 PM to 7 AM, please call Elink for urgent questions

## 2021-03-02 NOTE — Telephone Encounter (Signed)
Spoke with pt, requesting a refill of her Advair. This has been sent to preferred pharmacy.  Pt notes that she has been diagnosed with Covid. C/o wheezing, dyspnea, body aches. Pt has been taking Primatene Mist, Advair, and Tylenol.   Pt requests any additional recs.   [facility].   Dr. Everardo All please advise, thanks!

## 2021-03-02 NOTE — Telephone Encounter (Signed)
lmtcb for pt.  

## 2021-03-25 ENCOUNTER — Other Ambulatory Visit: Payer: Self-pay

## 2021-03-25 ENCOUNTER — Ambulatory Visit (INDEPENDENT_AMBULATORY_CARE_PROVIDER_SITE_OTHER): Payer: Medicare Other | Admitting: Otolaryngology

## 2021-03-25 VITALS — Temp 96.6°F

## 2021-03-25 DIAGNOSIS — H6123 Impacted cerumen, bilateral: Secondary | ICD-10-CM | POA: Diagnosis not present

## 2021-03-25 DIAGNOSIS — H903 Sensorineural hearing loss, bilateral: Secondary | ICD-10-CM | POA: Diagnosis not present

## 2021-03-25 NOTE — Progress Notes (Signed)
HPI: [name] [name] is a 75 y.o. female who presents for evaluation of hearing problems.  She is also complained of ringing or tinnitus in her ears.  She obtained her hearing aids while living in [place] in 2019.  She is just recently moved back to [place] and follows up here for hearing problems.  Past Medical History:  Diagnosis Date  . High cholesterol   . Hyperlipemia   . Renal disorder   . Thyroid disease    Past Surgical History:  Procedure Laterality Date  . ROTATOR CUFF REPAIR Right 2014  . TONSILLECTOMY     Social History   Socioeconomic History  . Marital status: Divorced    Spouse name: Not on file  . Number of children: Not on file  . Years of education: Not on file  . Highest education level: Not on file  Occupational History  . Not on file  Tobacco Use  . Smoking status: Former Smoker    Years: 1.00    Types: Cigarettes    Start date: 1963    Quit date: 1963    Years since quitting: 59.4  . Smokeless tobacco: Never Used  . Tobacco comment: passive cigarette smoke exposure, smoked in high school  Substance and Sexual Activity  . Alcohol use: Yes    Comment: occasional  . Drug use: No  . Sexual activity: Not on file  Other Topics Concern  . Not on file  Social History Narrative  . Not on file   Social Determinants of Health   Financial Resource Strain: Not on file  Food Insecurity: Not on file  Transportation Needs: Not on file  Physical Activity: Not on file  Stress: Not on file  Social Connections: Not on file   Family History  Problem Relation Age of Onset  . Emphysema Mother   . Heart disease Father   . Rheum arthritis Maternal Grandmother    Allergies  Allergen Reactions  . Prednisone     Face turns red  . Shellfish Allergy Swelling  . Sulfonamide Derivatives Nausea Only   Prior to Admission medications   Medication Sig Start Date End Date Taking? Authorizing Provider  aspirin EC 81 MG tablet Take 1 tablet (81  mg total) by mouth daily. Swallow whole. 09/12/20   Baldo Daub, MD  fluticasone (FLONASE) 50 MCG/ACT nasal spray Place 2 sprays into the nose daily. 01/24/12 01/23/13  Storm Frisk, MD  fluticasone-salmeterol (ADVAIR HFA) [phone] MCG/ACT inhaler Inhale 2 puffs into the lungs 2 (two) times daily. 03/02/21   Luciano Cutter, MD  levothyroxine (SYNTHROID) 88 MCG tablet Take 88 mcg by mouth daily.    [provider]  nitroGLYCERIN (NITROSTAT) 0.4 MG SL tablet Place 1 tablet (0.4 mg total) under the tongue every 5 (five) minutes as needed for chest pain. 07/23/20 10/21/20  Baldo Daub, MD  rosuvastatin (CRESTOR) 5 MG tablet Take 1 tablet (5 mg total) by mouth daily. 09/12/20   Baldo Daub, MD  sertraline (ZOLOFT) 50 MG tablet Take 50 mg by mouth daily.     [provider]     Positive ROS: Otherwise negative  All other systems have been reviewed and were otherwise negative with the exception of those mentioned in the HPI and as above.  Physical Exam: Constitutional: Alert, well-appearing, no acute distress Ears: External ears without lesions or tenderness. Ear canals are small bilaterally.  She had small amount of wax in the lateral portion of both  ear canals that was cleaned with curettes.  The TMs were clear bilaterally. Nasal: External nose without lesions.. Clear nasal passages Oral: Lips and gums without lesions. Tongue and palate mucosa without lesions. Posterior oropharynx clear. Neck: No palpable adenopathy or masses Respiratory: Breathing comfortably  Skin: No facial/neck lesions or rash noted.  Cerumen impaction removal  Date/Time: 03/25/2021 9:07 AM Performed by: Drema Halon, MD Authorized by: Drema Halon, MD   Consent:    Consent obtained:  Verbal   Consent given by:  Patient   Risks discussed:  Pain and bleeding Procedure details:    Location:  L ear and R ear   Procedure type: curette   Post-procedure details:     Inspection:  TM intact and canal normal   Hearing quality:  Improved   Patient tolerance of procedure:  Tolerated well, no immediate complications Comments:     Patient with small ear canals bilaterally.  Both TMs are clear.  Audiologic testing in the office today demonstrated mild to moderate bilateral symmetric sensorineural hearing loss with SRT's of 40 DB on the right and 35 DB on the left.  Her hearing aids in filters were cleaned with our audiologist and she is doing well presently.  Assessment: Bilateral sensorineural hearing loss  Plan: She will follow-up on a as needed basis. Presently doing well with her present hearing aids.  Narda Bonds, MD

## 2021-05-20 ENCOUNTER — Telehealth: Payer: Self-pay | Admitting: Pulmonary Disease

## 2021-05-20 NOTE — Telephone Encounter (Signed)
Pt stated that she has been wheezing a lot lately and stated that it feels like there is a build up of mucus in her throat. Pt stated that she is coughing up mucus (yellowish). Pt stated that she has not been using her inhaler because it makes her throat raspy.  Pharmacy; [facility]; [address] [place], [place] [address]  Pls regard; [phone]

## 2021-05-20 NOTE — Telephone Encounter (Signed)
Spoke with the pt  She states stopped her advair several wks ago due to it causing hoarseness  She has been having increased cough and wheezing since She states that she feels like there is mucus buildup in her throat  When does see sputum it's clear to light yellow  Slight increase in SOB No fever, chills  She had covid mid may 2022   I scheduled her for appt with TP for 05/25/21 and she is okay waiting until then  Will call sooner if needed

## 2021-05-25 ENCOUNTER — Ambulatory Visit (INDEPENDENT_AMBULATORY_CARE_PROVIDER_SITE_OTHER): Payer: Medicare Other | Admitting: Adult Health

## 2021-05-25 ENCOUNTER — Encounter: Payer: Self-pay | Admitting: Adult Health

## 2021-05-25 ENCOUNTER — Other Ambulatory Visit: Payer: Self-pay

## 2021-05-25 ENCOUNTER — Other Ambulatory Visit: Payer: Self-pay | Admitting: Adult Health

## 2021-05-25 DIAGNOSIS — R053 Chronic cough: Secondary | ICD-10-CM | POA: Diagnosis not present

## 2021-05-25 DIAGNOSIS — J449 Chronic obstructive pulmonary disease, unspecified: Secondary | ICD-10-CM | POA: Diagnosis not present

## 2021-05-25 MED ORDER — ALBUTEROL SULFATE 108 (90 BASE) MCG/ACT IN AEPB: 1.0000 | INHALATION_SPRAY | Freq: Four times a day (QID) | RESPIRATORY_TRACT | 2 refills | Status: DC | PRN

## 2021-05-25 MED ORDER — BUDESONIDE-FORMOTEROL FUMARATE 80-4.5 MCG/ACT IN AERO: 2.0000 | INHALATION_SPRAY | Freq: Two times a day (BID) | RESPIRATORY_TRACT | 5 refills | Status: DC

## 2021-05-25 NOTE — Progress Notes (Signed)
@Patient  ID: , female    DOB: September 15, 1946, 75 y.o.   MRN: [id number]  Chief Complaint  Patient presents with   Follow-up    Referring provider: [id number], MD  HPI: 75 year old female seen for pulmonary consult December 08, 2020 for cough Previously seen by Dr. December 10, 2020 and Dr. Delford Field in 2013 for cough Medical history significant for A. fib, hypothyroidism, chronic kidney disease stage III  TEST/EVENTS :  09/21/10 FVC 3.25 (93%) FEV1 2.3 (90%) Ratio 68  TLC 92% DLCO 101% Interpretation: Mild obstructive defect is present. FEV1/FVC ratio <70%.  05/25/2021 Follow up : Cough  Patient returns for a follow-up visit.  She was seen last in February 2022 for pulmonary consult for cough.  Patient has had a history of chronic cough in the past.  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  She says insurance is not covering her inhaler. She says she continues to have ongoing cough and intermittent wheezing.  She did develop COVID-19 infection in May.  Since then she has been having some increased cough and intermittent wheezing.  She denies any hemoptysis chest pain orthopnea PND or increased leg swelling.    Allergies  Allergen Reactions   Shellfish Allergy Swelling   Prednisone Other (See Comments)    Face turns red   Sulfonamide Derivatives Nausea Only    Immunization History  Administered Date(s) Administered   Influenza Split 08/26/2011, 08/03/2013   Influenza Whole 09/11/2010   Influenza, High Dose Seasonal PF 07/11/2012, 08/19/2014, 08/06/2015, 08/22/2018, 07/25/2019   Influenza,inj,quad, With Preservative 09/24/2020   PFIZER(Purple Top)SARS-COV-2 Vaccination 11/16/2019, 12/07/2019, 02/09/2021   Pneumococcal Conjugate-13 08/08/2014   Pneumococcal Polysaccharide-23 05/11/2010, 08/13/2012   Zoster, Live 05/08/2010, 11/23/2010    Past Medical History:  Diagnosis Date   High cholesterol    Hyperlipemia    Renal disorder     Thyroid disease     Tobacco History: Social History   Tobacco Use  Smoking Status Former   Years: 1.00   Types: Cigarettes   Start date: 10/1961   Quit date: 09/1962   Years since quitting: 58.7  Smokeless Tobacco Never  Tobacco Comments   passive cigarette smoke exposure, smoked in high school   Counseling given: Not Answered Tobacco comments: passive cigarette smoke exposure, smoked in high school   Outpatient Medications Prior to Visit  Medication Sig Dispense Refill   Albuterol Sulfate (PROAIR RESPICLICK) 108 (90 Base) MCG/ACT AEPB Inhale into the lungs.     aspirin EC 81 MG tablet Take 1 tablet (81 mg total) by mouth daily. Swallow whole. 90 tablet 3   levothyroxine (SYNTHROID) 88 MCG tablet Take 88 mcg by mouth daily.     nitroGLYCERIN (NITROSTAT) 0.4 MG SL tablet Place 1 tablet (0.4 mg total) under the tongue every 5 (five) minutes as needed for chest pain. 90 tablet 3   rosuvastatin (CRESTOR) 5 MG tablet Take 1 tablet (5 mg total) by mouth daily. 90 tablet 3   sertraline (ZOLOFT) 50 MG tablet Take 50 mg by mouth daily.      fluticasone (FLONASE) 50 MCG/ACT nasal spray Place 2 sprays into the nose daily. 16 g 6   fluticasone-salmeterol (ADVAIR HFA) 115-21 MCG/ACT inhaler Inhale 2 puffs into the lungs 2 (two) times daily. 3 each 3   hydrochlorothiazide (MICROZIDE) 12.5 MG capsule Take by mouth. (Patient not taking: Reported on 05/25/2021)     predniSONE (STERAPRED UNI-PAK 21 TAB) 5 MG (21) TBPK tablet  TAKE 6 TABLETS ON DAY 1 AS DIRECTED ON PACKAGE AND DECREASE BY 1 TAB EACH DAY FOR A TOTAL OF 6 DAYS (Patient not taking: Reported on 05/25/2021)     No facility-administered medications prior to visit.     Review of Systems:   Constitutional:   No  weight loss, night sweats,  Fevers, chills, fatigue, or  lassitude.  HEENT:   No headaches,  Difficulty swallowing,  Tooth/dental problems, or  Sore throat,                No sneezing, itching, ear ache,  +nasal congestion, post  nasal drip,   CV:  No chest pain,  Orthopnea, PND, swelling in lower extremities, anasarca, dizziness, palpitations, syncope.   GI  No heartburn, indigestion, abdominal pain, nausea, vomiting, diarrhea, change in bowel habits, loss of appetite, bloody stools.   Resp: .  No chest wall deformity  Skin: no rash or lesions.  GU: no dysuria, change in color of urine, no urgency or frequency.  No flank pain, no hematuria   MS:  No joint pain or swelling.  No decreased range of motion.  No back pain.    Physical Exam  BP 104/62 (BP Location: Left Arm, Patient Position: Sitting, Cuff Size: Normal)   Pulse 85   Temp 98.2 F (36.8 C) (Oral)   Ht 5' 8.5" (1.74 m)   Wt 186 lb 3.2 oz (84.5 kg)   SpO2 96%   BMI 27.90 kg/m   GEN: A/Ox3; pleasant , NAD, well nourished    HEENT:  [place]/AT,  EACs-clear, TMs-wnl, NOSE-clear, THROAT-clear, no lesions, no postnasal drip or exudate noted.   NECK:  Supple w/ fair ROM; no JVD; normal carotid impulses w/o bruits; no thyromegaly or nodules palpated; no lymphadenopathy.    RESP  Clear  P & A; w/o, wheezes/ rales/ or rhonchi. no accessory muscle use, no dullness to percussion  CARD:  RRR, no m/r/g, no peripheral edema, pulses intact, no cyanosis or clubbing.  GI:   Soft & nt; nml bowel sounds; no organomegaly or masses detected.   Musco: Warm bil, no deformities or joint swelling noted.   Neuro: alert, no focal deficits noted.    Skin: Warm, no lesions or rashes    Lab Results:  CBC No results found for: WBC, RBC, HGB, HCT, PLT, MCV, MCH, MCHC, RDW, LYMPHSABS, MONOABS, EOSABS, BASOSABS  BMET  BNP No results found for: BNP  ProBNP No results found for: PROBNP  Imaging: No results found.    No flowsheet data found.  No results found for: NITRICOXIDE      Assessment & Plan:   No problem-specific Assessment & Plan notes found for this encounter.     Rubye Oaks, NP 05/25/2021

## 2021-05-25 NOTE — Patient Instructions (Addendum)
Begin Symbicort 80 2 puffs Twice daily  , rinse after use. (Call back if not affordable)  Zyrtec 10mg  At bedtime  As needed   Albuterol inhaler As needed  wheezing .  Follow up with Dr. in 6-8 weeks and As needed   Please contact office for sooner follow up if symptoms do not improve or worsen or seek emergency care

## 2021-05-25 NOTE — Progress Notes (Signed)
@Patient  ID: , female    DOB: 08-10-1946, 75 y.o.   MRN: [id number]  Chief Complaint  Patient presents with   Follow-up    Referring provider: [id number], MD  HPI:   TEST/EVENTS :   Allergies  Allergen Reactions   Shellfish Allergy Swelling   Prednisone Other (See Comments)    Face turns red   Sulfonamide Derivatives Nausea Only    Immunization History  Administered Date(s) Administered   Influenza Split 08/26/2011, 08/03/2013   Influenza Whole 09/11/2010   Influenza, High Dose Seasonal PF 07/11/2012, 08/19/2014, 08/06/2015, 08/22/2018, 07/25/2019   Influenza,inj,quad, With Preservative 09/24/2020   PFIZER(Purple Top)SARS-COV-2 Vaccination 11/16/2019, 12/07/2019, 02/09/2021   Pneumococcal Conjugate-13 08/08/2014   Pneumococcal Polysaccharide-23 05/11/2010, 08/13/2012   Zoster, Live 05/08/2010, 11/23/2010    Past Medical History:  Diagnosis Date   High cholesterol    Hyperlipemia    Renal disorder    Thyroid disease     Tobacco History: Social History   Tobacco Use  Smoking Status Former   Years: 1.00   Types: Cigarettes   Start date: 10/1961   Quit date: 09/1962   Years since quitting: 58.7  Smokeless Tobacco Never  Tobacco Comments   passive cigarette smoke exposure, smoked in high school   Counseling given: Not Answered Tobacco comments: passive cigarette smoke exposure, smoked in high school   Outpatient Medications Prior to Visit  Medication Sig Dispense Refill   Albuterol Sulfate (PROAIR RESPICLICK) 108 (90 Base) MCG/ACT AEPB Inhale into the lungs.     aspirin EC 81 MG tablet Take 1 tablet (81 mg total) by mouth daily. Swallow whole. 90 tablet 3   levothyroxine (SYNTHROID) 88 MCG tablet Take 88 mcg by mouth daily.     nitroGLYCERIN (NITROSTAT) 0.4 MG SL tablet Place 1 tablet (0.4 mg total) under the tongue every 5 (five) minutes as needed for chest pain. 90 tablet 3   rosuvastatin (CRESTOR) 5 MG tablet Take 1 tablet (5  mg total) by mouth daily. 90 tablet 3   sertraline (ZOLOFT) 50 MG tablet Take 50 mg by mouth daily.      fluticasone (FLONASE) 50 MCG/ACT nasal spray Place 2 sprays into the nose daily. 16 g 6   fluticasone-salmeterol (ADVAIR HFA) 115-21 MCG/ACT inhaler Inhale 2 puffs into the lungs 2 (two) times daily. 3 each 3   hydrochlorothiazide (MICROZIDE) 12.5 MG capsule Take by mouth. (Patient not taking: Reported on 05/25/2021)     predniSONE (STERAPRED UNI-PAK 21 TAB) 5 MG (21) TBPK tablet TAKE 6 TABLETS ON DAY 1 AS DIRECTED ON PACKAGE AND DECREASE BY 1 TAB EACH DAY FOR A TOTAL OF 6 DAYS (Patient not taking: Reported on 05/25/2021)     No facility-administered medications prior to visit.     Review of Systems:   Constitutional:   No  weight loss, night sweats,  Fevers, chills, fatigue, or  lassitude.  HEENT:   No headaches,  Difficulty swallowing,  Tooth/dental problems, or  Sore throat,                No sneezing, itching, ear ache, nasal congestion, post nasal drip,   CV:  No chest pain,  Orthopnea, PND, swelling in lower extremities, anasarca, dizziness, palpitations, syncope.   GI  No heartburn, indigestion, abdominal pain, nausea, vomiting, diarrhea, change in bowel habits, loss of appetite, bloody stools.   Resp: No shortness of breath with exertion or at rest.  No excess mucus, no productive cough,  No non-productive  cough,  No coughing up of blood.  No change in color of mucus.  No wheezing.  No chest wall deformity  Skin: no rash or lesions.  GU: no dysuria, change in color of urine, no urgency or frequency.  No flank pain, no hematuria   MS:  No joint pain or swelling.  No decreased range of motion.  No back pain.    Physical Exam  BP 104/62 (BP Location: Left Arm, Patient Position: Sitting, Cuff Size: Normal)   Pulse 85   Temp 98.2 F (36.8 C) (Oral)   Ht 5' 8.5" (1.74 m)   Wt 186 lb 3.2 oz (84.5 kg)   SpO2 96%   BMI 27.90 kg/m   GEN: A/Ox3; pleasant , NAD, well nourished     HEENT:  [place]/AT,  EACs-clear, TMs-wnl, NOSE-clear, THROAT-clear, no lesions, no postnasal drip or exudate noted.   NECK:  Supple w/ fair ROM; no JVD; normal carotid impulses w/o bruits; no thyromegaly or nodules palpated; no lymphadenopathy.    RESP  Clear  P & A; w/o, wheezes/ rales/ or rhonchi. no accessory muscle use, no dullness to percussion  CARD:  RRR, no m/r/g, no peripheral edema, pulses intact, no cyanosis or clubbing.  GI:   Soft & nt; nml bowel sounds; no organomegaly or masses detected.   Musco: Warm bil, no deformities or joint swelling noted.   Neuro: alert, no focal deficits noted.    Skin: Warm, no lesions or rashes    Lab Results:  CBC No results found for: WBC, RBC, HGB, HCT, PLT, MCV, MCH, MCHC, RDW, LYMPHSABS, MONOABS, EOSABS, BASOSABS  BMET    Component Value Date/Time   NA 141 08/14/2020 1405   K 4.2 08/14/2020 1405   CL 105 08/14/2020 1405   CO2 25 08/14/2020 1405   GLUCOSE 100 (H) 08/14/2020 1405   BUN 13 08/14/2020 1405   CREATININE 1.12 (H) 08/14/2020 1405   CALCIUM 9.5 08/14/2020 1405   GFRNONAA 48 (L) 08/14/2020 1405   GFRAA 55 (L) 08/14/2020 1405    BNP No results found for: BNP  ProBNP No results found for: PROBNP  Imaging: No results found.    No flowsheet data found.  No results found for: NITRICOXIDE      Assessment & Plan:   No problem-specific Assessment & Plan notes found for this encounter.     Rubye Oaks, NP 05/25/2021

## 2021-05-26 NOTE — Telephone Encounter (Signed)
Please advise of change.

## 2021-05-29 DIAGNOSIS — J449 Chronic obstructive pulmonary disease, unspecified: Secondary | ICD-10-CM | POA: Insufficient documentation

## 2021-05-29 NOTE — Assessment & Plan Note (Signed)
Control for triggers  Plan  Patient Instructions  Begin Symbicort 80 2 puffs Twice daily  , rinse after use. (Call back if not affordable)  Zyrtec 10mg  At bedtime  As needed   Albuterol inhaler As needed  wheezing .  Follow up with Dr. in 6-8 weeks and As needed   Please contact office for sooner follow up if symptoms do not improve or worsen or seek emergency care

## 2021-05-29 NOTE — Assessment & Plan Note (Signed)
Mild COPD - restart maintenance inhaler   Plan  Patient Instructions  Begin Symbicort 80 2 puffs Twice daily  , rinse after use. (Call back if not affordable)  Zyrtec 10mg  At bedtime  As needed   Albuterol inhaler As needed  wheezing .  Follow up with Dr. in 6-8 weeks and As needed   Please contact office for sooner follow up if symptoms do not improve or worsen or seek emergency care

## 2021-06-02 ENCOUNTER — Other Ambulatory Visit: Payer: Self-pay | Admitting: Family Medicine

## 2021-06-02 ENCOUNTER — Ambulatory Visit
Admission: RE | Admit: 2021-06-02 | Discharge: 2021-06-02 | Disposition: A | Discharge status: Home / Self Care | Payer: Medicare Other | Source: Ambulatory Visit | Attending: Family Medicine | Admitting: Family Medicine

## 2021-06-02 ENCOUNTER — Other Ambulatory Visit: Payer: Self-pay

## 2021-06-02 DIAGNOSIS — R42 Dizziness and giddiness: Secondary | ICD-10-CM

## 2021-06-16 ENCOUNTER — Ambulatory Visit: Payer: Medicare Other | Admitting: Pulmonary Disease

## 2021-07-06 ENCOUNTER — Ambulatory Visit: Payer: Medicare Other | Admitting: Pulmonary Disease

## 2021-07-08 ENCOUNTER — Telehealth: Payer: Self-pay | Admitting: Adult Health

## 2021-07-08 NOTE — Telephone Encounter (Signed)
Called patient but she did not answer. Left message for patient to call back.  

## 2021-07-16 NOTE — Telephone Encounter (Signed)
Lmtcb for pt.  

## 2021-07-23 ENCOUNTER — Ambulatory Visit (INDEPENDENT_AMBULATORY_CARE_PROVIDER_SITE_OTHER): Payer: Medicare Other

## 2021-07-23 ENCOUNTER — Encounter: Payer: Self-pay | Admitting: Podiatry

## 2021-07-23 ENCOUNTER — Other Ambulatory Visit: Payer: Self-pay

## 2021-07-23 ENCOUNTER — Ambulatory Visit (INDEPENDENT_AMBULATORY_CARE_PROVIDER_SITE_OTHER): Payer: Medicare Other | Admitting: Podiatry

## 2021-07-23 DIAGNOSIS — F4320 Adjustment disorder, unspecified: Secondary | ICD-10-CM | POA: Insufficient documentation

## 2021-07-23 DIAGNOSIS — G5700 Lesion of sciatic nerve, unspecified lower limb: Secondary | ICD-10-CM | POA: Insufficient documentation

## 2021-07-23 DIAGNOSIS — M722 Plantar fascial fibromatosis: Secondary | ICD-10-CM

## 2021-07-23 DIAGNOSIS — E559 Vitamin D deficiency, unspecified: Secondary | ICD-10-CM | POA: Insufficient documentation

## 2021-07-23 DIAGNOSIS — M8580 Other specified disorders of bone density and structure, unspecified site: Secondary | ICD-10-CM | POA: Insufficient documentation

## 2021-07-23 MED ORDER — METHYLPREDNISOLONE 4 MG PO TBPK: ORAL_TABLET | ORAL | 0 refills | Status: DC

## 2021-07-23 MED ORDER — TRIAMCINOLONE ACETONIDE 40 MG/ML IJ SUSP
20.0000 mg | Freq: Once | INTRAMUSCULAR | Status: AC
  Administered 2021-07-23: 20 mg

## 2021-07-23 NOTE — Patient Instructions (Signed)

## 2021-07-23 NOTE — Telephone Encounter (Signed)
Will close encounter as we have attempted to reach pt two times.

## 2021-07-24 NOTE — Progress Notes (Signed)
Subjective:  Patient ID: [name] [name], female    DOB: 1946/04/05,  MRN: [id number] HPI Chief Complaint  Patient presents with   Foot Pain    Plantar heel right - aching x 2 months, AM pain and now constant, tried Tylenol, rolling on frozen bottle-no help   New Patient (Initial Visit)    75 y.o. female presents with the above complaint.   ROS: Denies fever chills nausea vomiting muscle aches pains calf pain back pain chest pain shortness of breath.  Past Medical History:  Diagnosis Date   High cholesterol    Hyperlipemia    Renal disorder    Thyroid disease    Past Surgical History:  Procedure Laterality Date   ROTATOR CUFF REPAIR Right 2014   TONSILLECTOMY      Current Outpatient Medications:    methylPREDNISolone (MEDROL DOSEPAK) 4 MG TBPK tablet, 6 day dose pack - take as directed, Disp: 21 tablet, Rfl: 0   aspirin EC 81 MG tablet, Take 1 tablet (81 mg total) by mouth daily. Swallow whole., Disp: 90 tablet, Rfl: 3   budesonide-formoterol (SYMBICORT) 80-4.5 MCG/ACT inhaler, Inhale 2 puffs into the lungs 2 (two) times daily., Disp: 1 each, Rfl: 5   levothyroxine (SYNTHROID) 88 MCG tablet, Take 88 mcg by mouth daily., Disp: , Rfl:    nitroGLYCERIN (NITROSTAT) 0.4 MG SL tablet, Place 1 tablet (0.4 mg total) under the tongue every 5 (five) minutes as needed for chest pain., Disp: 90 tablet, Rfl: 3   rosuvastatin (CRESTOR) 5 MG tablet, Take 1 tablet (5 mg total) by mouth daily., Disp: 90 tablet, Rfl: 3   sertraline (ZOLOFT) 50 MG tablet, Take 50 mg by mouth daily. , Disp: , Rfl:   Allergies  Allergen Reactions   Shellfish Allergy Swelling   Prednisone Other (See Comments)    Face turns red   Sulfonamide Derivatives Nausea Only   Review of Systems Objective:  There were no vitals filed for this visit.  General: Well developed, nourished, in no acute distress, alert and oriented x3   Dermatological: Skin is warm, dry and supple bilateral. Nails x 10 are well  maintained; remaining integument appears unremarkable at this time. There are no open sores, no preulcerative lesions, no rash or signs of infection present.  Vascular: Dorsalis Pedis artery and Posterior Tibial artery pedal pulses are 2/4 bilateral with immedate capillary fill time. Pedal hair growth present. No varicosities and no lower extremity edema present bilateral.   Neruologic: Grossly intact via light touch bilateral. Vibratory intact via tuning fork bilateral. Protective threshold with Semmes Wienstein monofilament intact to all pedal sites bilateral. Patellar and Achilles deep tendon reflexes 2+ bilateral. No Babinski or clonus noted bilateral.   Musculoskeletal: No gross boney pedal deformities bilateral. No pain, crepitus, or limitation noted with foot and ankle range of motion bilateral. Muscular strength 5/5 in all groups tested bilateral.  Pain on palpation medial calcaneal tubercle of the right foot.  No pain on medial lateral compression of the calcaneus.  Gait: Unassisted, Nonantalgic.    Radiographs:  Radiographs taken today demonstrate a rectus foot type mild hammertoe deformity second mild hallux valgus deformity mild tailor's bunion deformity.  Also demonstrates small plantar distally oriented calcaneal spur the soft tissue increase in density plantar fascial pain insertion site.  No other acute osseous abnormalities are identified.  Assessment & Plan:   Assessment: Planter fasciitis right foot.  Plan: Discussed etiology pathology conservative versus surgical therapies.  Discussed appropriate shoe gear stretching exercise ice  therapy and shoe gear modifications.  At this point I started her on methylprednisolone.  I injected her right heel at the plantar fascial calcaneal insertion site.  She tolerated this well without complications.  I placed her in a plantar fascial brace and we will follow-up with her in 1 month     [name] [name] T. [place], [place]

## 2021-08-25 ENCOUNTER — Ambulatory Visit: Payer: Medicare Other | Admitting: Podiatry

## 2021-09-08 ENCOUNTER — Telehealth (INDEPENDENT_AMBULATORY_CARE_PROVIDER_SITE_OTHER): Payer: Medicare Other | Admitting: Pulmonary Disease

## 2021-09-08 ENCOUNTER — Telehealth: Payer: Self-pay | Admitting: Pulmonary Disease

## 2021-09-08 DIAGNOSIS — J449 Chronic obstructive pulmonary disease, unspecified: Secondary | ICD-10-CM | POA: Diagnosis not present

## 2021-09-08 DIAGNOSIS — J441 Chronic obstructive pulmonary disease with (acute) exacerbation: Secondary | ICD-10-CM | POA: Diagnosis not present

## 2021-09-08 MED ORDER — ALBUTEROL SULFATE HFA 108 (90 BASE) MCG/ACT IN AERS: 2.0000 | INHALATION_SPRAY | Freq: Four times a day (QID) | RESPIRATORY_TRACT | 6 refills | Status: DC | PRN

## 2021-09-08 NOTE — Telephone Encounter (Signed)
Call  to patient, confirmed DOB. Patient report increased wheezing and coughing that started Saturday. She reports the cough is dry and it just feels like the irritation is in her throat. She states typically she gets prednisone and it normally takes care of it. She is doing a lot of throat clearing. She is not using her symbicort at all stating it does not help. Denies fever, chills, sweats, or body aches. She has not been around sick. Requesting prednisone.   JE please advise. Thanks

## 2021-09-08 NOTE — Telephone Encounter (Signed)
Call made to patient, video visit appt time set.   Nothing further needed at this time.

## 2021-09-08 NOTE — Patient Instructions (Addendum)
COPD CONTINUE Symbicort 80-4.5 mcg TWO puffs TWICE a day START Albuterol AS NEEDED for shortness of breath or wheezing START prednisone taper for worsening wheezing, shortness of breath or cough  Follow-up with me 09/23/21 at 4:15 PM.

## 2021-09-08 NOTE — Progress Notes (Signed)
Virtual Visit via Video Note  I connected with [name] [name] on 09/10/21 at  4:15 PM EST by a video enabled telemedicine application and verified that I am speaking with the correct person using two identifiers.  Location: Patient: Home Provider: [facility]   I discussed the limitations of evaluation and management by telemedicine and the availability of in person appointments. The patient expressed understanding and agreed to proceed.  I discussed the assessment and treatment plan with the patient. The patient was provided an opportunity to ask questions and all were answered. The patient agreed with the plan and demonstrated an understanding of the instructions.   The patient was advised to call back or seek an in-person evaluation if the symptoms worsen or if the condition fails to improve as anticipated.  I provided 35 minutes of non-face-to-face time during this encounter.   [name] Shrode Mechele Collin, MD   Subjective:   PATIENT ID: [name] [name]: female DOB: [birth year], MRN: [id number]   HPI  No chief complaint on file.   Reason for Visit: Follow-up  [name] is a 75 year old female with paroxysmal atrial fibrillation, hypothyroidism, CKD stage III who presents for chronic cough.  Initial Consult 12/08/20 She was previously evaluated at [facility] pulmonary by Dr. Delton Coombes and Dr. Delford Field.  Prior notes reviewed with her last visit in April 2013.  Per notes, she had PFTs were overall normal with possible airflow limitation.  She had previously trialed Spiriva however self discontinued due to hoarseness.  Dr. Delford Field had treated her with prednisone and antihistamines and PPI however she was lost to follow-up after moving.  Her recent note with her PCP on 09/05/2020 with Dr. Catha Gosselin states that she has persistent cough the last 5 to 6 weeks, occurring at least twice a year.  She is currently on a prednisone pack for back pain and this has not helped with  her cough.  Dr. Clarene Duke has prescribed her Tessalon Perles, Zyrtec and Benadryl and omeprazole 20 mg daily.  Referred to pulmonary for further management.  On this visit, she reports long standing cough for >10 years. Associated with wheezing especially with exertion including when walking upstairs or exercise. Her symptoms were similar when she was in [place] vs [place]. Her symptoms will worsen when having a cough and will be associated with chest congestion. She has tried diet restrictions and PPI without improvement. She usually plays golf once a week but has noticed she will get out of breath easily. She is trying to get more exercise by walking 1/2 miles in her building 1-2 times. She has tried an over the encounter inhaler (?Albuterol) without much relief but doesn't recall the name of it.   09/08/21 Since our last visit, she was treated for COPD exacerbation in May (COVID-19) and September. She has been wheezing and coughing while helping her daughter recently. She is on Symbicort two days a week as she does not feel it helps. Does not have albuterol. Does not have spacer.  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  Past Medical History:  Diagnosis Date   High cholesterol    Hyperlipemia    Renal disorder    Thyroid disease      Family History  Problem Relation Age of Onset  Emphysema Mother    Heart disease Father    Rheum arthritis Maternal Grandmother      Social History   Occupational History   Not on file  Tobacco Use   Smoking status: Former    Years: 1.00    Types: Cigarettes    Start date: 10/1961    Quit date: 09/1962    Years since quitting: 58.9   Smokeless tobacco:  Never   Tobacco comments:    passive cigarette smoke exposure, smoked in high school  Substance and Sexual Activity   Alcohol use: Yes    Comment: occasional   Drug use: No   Sexual activity: Not on file    Allergies  Allergen Reactions   Shellfish Allergy Swelling   Prednisone Other (See Comments)    Face turns red   Sulfonamide Derivatives Nausea Only     Outpatient Medications Prior to Visit  Medication Sig Dispense Refill   aspirin EC 81 MG tablet Take 1 tablet (81 mg total) by mouth daily. Swallow whole. 90 tablet 3   budesonide-formoterol (SYMBICORT) 80-4.5 MCG/ACT inhaler Inhale 2 puffs into the lungs 2 (two) times daily. 1 each 5   levothyroxine (SYNTHROID) 88 MCG tablet Take 88 mcg by mouth daily.     methylPREDNISolone (MEDROL DOSEPAK) 4 MG TBPK tablet 6 day dose pack - take as directed 21 tablet 0   nitroGLYCERIN (NITROSTAT) 0.4 MG SL tablet Place 1 tablet (0.4 mg total) under the tongue every 5 (five) minutes as needed for chest pain. 90 tablet 3   rosuvastatin (CRESTOR) 5 MG tablet Take 1 tablet (5 mg total) by mouth daily. 90 tablet 3   sertraline (ZOLOFT) 50 MG tablet Take 50 mg by mouth daily.      No facility-administered medications prior to visit.    Review of Systems  Constitutional:  Negative for chills, diaphoresis, fever, malaise/fatigue and weight loss.  HENT:  Negative for congestion.   Respiratory:  Positive for cough, shortness of breath and wheezing. Negative for hemoptysis and sputum production.   Cardiovascular:  Negative for chest pain, palpitations and leg swelling.    Objective:   There were no vitals filed for this visit.    Physical Exam: General: Well-appearing, no acute distress HENT: , AT Eyes: EOMI, no scleral icterus Respiratory: No respiratory distress. Able to speak full sentences. No audible wheezing Neuro: AAO x4, CNII-XII grossly intact Psych: Normal mood, normal affect  Data Reviewed:  Imaging: CXR 10/22/11 - Mild  hyperinflation. No infiltrate, effusion or edema CT Coronary lung fields 08/20/21 - no evidence of interstitial/parenchymal changes, no infiltrate or ground glass opacities  PFT: 09/21/10 FVC 3.25 (93%) FEV1 2.3 (90%) Ratio 68  TLC 92% DLCO 101% Interpretation: Mild obstructive defect is present. FEV1/FVC ratio <70%.  Labs: CBC No results found for: WBC, RBC, HGB, HCT, PLT, MCV, MCH, MCHC, RDW, LYMPHSABS, MONOABS, EOSABS, BASOSABS  BMET    Component Value Date/Time   NA 141 08/14/2020 1405   K 4.2 08/14/2020 1405   CL 105 08/14/2020 1405   CO2 25 08/14/2020 1405   GLUCOSE 100 (H) 08/14/2020 1405   BUN 13 08/14/2020 1405   CREATININE 1.12 (H) 08/14/2020 1405   CALCIUM 9.5 08/14/2020 1405   GFRNONAA 48 (L) 08/14/2020 1405   GFRAA 55 (L) 08/14/2020 1405    Assessment & Plan:   Discussion: 75 year old female with COPD, pAF, hypothyroidism, CKD stage III who presents for follow-up. We discussed bronchodilator management of COPD and action plan  for exacerbation. Provided inhaler teaching with spacer. Discussed difficulty understanding her inhaler options and pricing (note prior issues with [place] inhalers in notes). She has an updated insurance card and will need assistance in uploading this with staff  COPD CONTINUE Symbicort 80-4.5 mcg TWO puffs TWICE a day START Albuterol AS NEEDED for shortness of breath or wheezing START prednisone taper for worsening wheezing, shortness of breath or cough Will need to arrange Pulmonary Function Tests prior to your next visit. Last PFT in 2011 Will message staff to contact patient to update her current insurance and then investigate new bronchodilator prices.   Health Maintenance Immunization History  Administered Date(s) Administered   Influenza Split 08/26/2011, 08/03/2013   Influenza Whole 09/11/2010   Influenza, High Dose Seasonal PF 07/11/2012, 08/19/2014, 08/06/2015, 08/22/2018, 07/25/2019   Influenza,inj,quad, With Preservative  09/24/2020   PFIZER(Purple Top)SARS-COV-2 Vaccination 11/16/2019, 12/07/2019, 02/09/2021   Pneumococcal Conjugate-13 08/08/2014   Pneumococcal Polysaccharide-23 05/11/2010, 08/13/2012   Zoster, Live 05/08/2010, 11/23/2010   CT Lung Screen - not indicated  No orders of the defined types were placed in this encounter.  Meds ordered this encounter  Medications   albuterol (VENTOLIN HFA) 108 (90 Base) MCG/ACT inhaler    Sig: Inhale 2 puffs into the lungs every 6 (six) hours as needed for wheezing or shortness of breath.    Dispense:  8 g    Refill:  6   predniSONE (DELTASONE) 10 MG tablet    Sig: Take 4 tablets (40 mg total) by mouth daily with breakfast for 2 days, THEN 3 tablets (30 mg total) daily with breakfast for 2 days, THEN 2 tablets (20 mg total) daily with breakfast for 2 days, THEN 1 tablet (10 mg total) daily with breakfast for 2 days.    Dispense:  20 tablet    Refill:  0    Return in about 15 days (around 09/23/2021).  I have spent a total time of 36-minutes on the day of the appointment reviewing prior documentation, coordinating care and discussing medical diagnosis and plan with the patient/family. Past medical history, allergies, medications were reviewed. Pertinent imaging, labs and tests included in this note have been reviewed and interpreted independently by me.  Bell Carbo Mechele Collin, MD [facility] 09/08/2021 4:35 PM  Office Number [phone]

## 2021-09-08 NOTE — Telephone Encounter (Signed)
Please let patient know I would like to schedule a video visit. It's been >6 months since our last clinic visit and I am working in clinic today so happy schedule anytime this afternoon.

## 2021-09-10 MED ORDER — PREDNISONE 10 MG PO TABS: ORAL_TABLET | ORAL | 0 refills | Status: AC

## 2021-09-11 ENCOUNTER — Telehealth: Payer: Self-pay | Admitting: *Deleted

## 2021-09-11 NOTE — Telephone Encounter (Signed)
Called the pt Someone answered but then hung up  Will call back later Forwarding to triage for f/u

## 2021-09-11 NOTE — Telephone Encounter (Signed)
-----   Message from Chi Mechele Collin, MD sent at 09/10/2021  4:14 PM EST ----- Regarding: Patient requesting assistance to update insurance. Will also need pharmacy to place test claim for inhalers Patient will need to be called to ensure that her insurance on file is updated.  Once this is completed, will need to run test claims for inhalers. See previous notes but she has had difficulty with her insurance understanding what is covered and was even advised to obtain [place] medications (not a rec from our office).  ICS dosing preferred: Symbicort 80 or 160 mcg Wixela 100 Advair 115 Dulera 100 Breo 100

## 2021-09-14 NOTE — Telephone Encounter (Signed)
Called the pt and there was no answer- LMTCB    

## 2021-09-15 NOTE — Telephone Encounter (Signed)
Spoke with pt and verified that pt's primary insurance in Medicare/ Medicare Part A & B with a plan G supplement from [place]. Pt does not want [place] to submit any test claims at this time as she wants to wait to see Dr. Everardo All on 09/23/21 and go from there. Nothing further needed at this time.

## 2021-09-23 ENCOUNTER — Other Ambulatory Visit: Payer: Self-pay

## 2021-09-23 ENCOUNTER — Encounter: Payer: Self-pay | Admitting: Pulmonary Disease

## 2021-09-23 ENCOUNTER — Ambulatory Visit (INDEPENDENT_AMBULATORY_CARE_PROVIDER_SITE_OTHER): Payer: Medicare Other | Admitting: Pulmonary Disease

## 2021-09-23 VITALS — BP 100/58 | HR 94 | Temp 97.9°F | Ht 68.0 in | Wt 180.4 lb

## 2021-09-23 DIAGNOSIS — J449 Chronic obstructive pulmonary disease, unspecified: Secondary | ICD-10-CM

## 2021-09-23 DIAGNOSIS — R32 Unspecified urinary incontinence: Secondary | ICD-10-CM | POA: Diagnosis not present

## 2021-09-23 NOTE — Progress Notes (Signed)
Subjective:   PATIENT ID: [name] [name] GENDER: female DOB: 1946/03/16, MRN: [id number]   HPI  Chief Complaint  Patient presents with   Follow-up    Feeling better after prednisone.     Reason for Visit: Follow-up  [name] [name] is a 75 year old female with paroxysmal atrial fibrillation, hypothyroidism, CKD stage III who presents for chronic cough.  Initial Consult 12/08/20 She was previously evaluated at [facility] pulmonary by Dr. Delton Coombes and Dr. Delford Field.  Prior notes reviewed with her last visit in April 2013.  Per notes, she had PFTs were overall normal with possible airflow limitation.  She had previously trialed Spiriva however self discontinued due to hoarseness.  Dr. Delford Field had treated her with prednisone and antihistamines and PPI however she was lost to follow-up after moving.  Her recent note with her PCP on 09/05/2020 with Dr. Catha Gosselin states that she has persistent cough the last 5 to 6 weeks, occurring at least twice a year.  She is currently on a prednisone pack for back pain and this has not helped with her cough.  Dr. Clarene Duke has prescribed her Tessalon Perles, Zyrtec and Benadryl and omeprazole 20 mg daily.  Referred to pulmonary for further management.  On this visit, she reports long standing cough for >10 years. Associated with wheezing especially with exertion including when walking upstairs or exercise. Her symptoms were similar when she was in [place] vs [place]. Her symptoms will worsen when having a cough and will be associated with chest congestion. She has tried diet restrictions and PPI without improvement. She usually plays golf once a week but has noticed she will get out of breath easily. She is trying to get more exercise by walking 1/2 miles in her building 1-2 times. She has tried an over the encounter inhaler (?Albuterol) without much relief but doesn't recall the name of it.   09/08/21 (Video) Since our last visit, she was treated  for COPD exacerbation in May (COVID-19) and September. She has been wheezing and coughing while helping her daughter recently. She is on Symbicort two days a week as she does not feel it helps. Does not have albuterol. Does not have spacer.  09/23/21 Since our last visit she has completed steroids. Not on budesonide currently. She has wheezing when she is sick or weather related. At baseline she is moderately active, playing golf during good weather. No symptoms when she is doing well.  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 burning stove in the house Son - [employer]  Environmental exposures: None   Past Medical History:  Diagnosis Date   High cholesterol    Hyperlipemia    Renal disorder    Thyroid disease      Family History  Problem Relation Age of Onset   Emphysema Mother    Heart disease Father    Rheum arthritis Maternal Grandmother      Social History   Occupational History   Not on file  Tobacco Use   Smoking status: Former    Years: 1.00    Types: Cigarettes    Start date: 10/1961    Quit date: 09/1962    Years since quitting: 59.0   Smokeless tobacco: Never   Tobacco comments:    passive cigarette smoke exposure, smoked in high school  Substance and Sexual Activity   Alcohol use: Yes    Comment: occasional  Drug use: No   Sexual activity: Not on file    Allergies  Allergen Reactions   Shellfish Allergy Swelling   Prednisone Other (See Comments)    Face turns red   Sulfonamide Derivatives Nausea Only     Outpatient Medications Prior to Visit  Medication Sig Dispense Refill   albuterol (VENTOLIN HFA) 108 (90 Base) MCG/ACT inhaler Inhale 2 puffs into the lungs every 6 (six) hours as needed for wheezing or shortness of breath. 8 g 6   aspirin EC 81 MG tablet Take 1 tablet (81 mg total) by mouth daily. Swallow whole. 90 tablet 3    budesonide-formoterol (SYMBICORT) 80-4.5 MCG/ACT inhaler Inhale 2 puffs into the lungs 2 (two) times daily. 1 each 5   levothyroxine (SYNTHROID) 88 MCG tablet Take 88 mcg by mouth daily.     rosuvastatin (CRESTOR) 5 MG tablet Take 1 tablet (5 mg total) by mouth daily. 90 tablet 3   sertraline (ZOLOFT) 50 MG tablet Take 50 mg by mouth daily.      nitroGLYCERIN (NITROSTAT) 0.4 MG SL tablet Place 1 tablet (0.4 mg total) under the tongue every 5 (five) minutes as needed for chest pain. 90 tablet 3   No facility-administered medications prior to visit.    Review of Systems  Constitutional:  Negative for chills, diaphoresis, fever, malaise/fatigue and weight loss.  HENT:  Negative for congestion.   Respiratory:  Positive for wheezing. Negative for cough, hemoptysis, sputum production and shortness of breath.   Cardiovascular:  Negative for chest pain, palpitations and leg swelling.    Objective:   Vitals:   09/23/21 1634  BP: (!) 100/58  Pulse: 94  Temp: 97.9 F (36.6 C)  TempSrc: Oral  SpO2: 94%  Weight: 180 lb 6.4 oz (81.8 kg)  Height: 5\' 8"  (1.727 m)    SpO2: 94 % O2 Device: None (Room air)  Physical Exam: General: Well-appearing, no acute distress HENT: [place], AT Eyes: EOMI, no scleral icterus Respiratory: Clear to auscultation bilaterally.  No crackles, wheezing or rales Cardiovascular: RRR, -M/R/G, no JVD Extremities:-Edema,-tenderness Neuro: AAO x4, CNII-XII grossly intact Psych: Normal mood, normal affect  Data Reviewed:  Imaging: CXR 10/22/11 - Mild hyperinflation. No infiltrate, effusion or edema CT Coronary lung fields 08/20/20 - no evidence of interstitial/parenchymal changes, no infiltrate or ground glass opacities  PFT: 09/21/10 FVC 3.25 (93%) FEV1 2.3 (90%) Ratio 68  TLC 92% DLCO 101% Interpretation: Mild obstructive defect is present. FEV1/FVC ratio <70%.  Labs: CBC No results found for: WBC, RBC, HGB, HCT, PLT, MCV, MCH, MCHC, RDW, LYMPHSABS, MONOABS,  EOSABS, BASOSABS  BMET    Component Value Date/Time   NA 141 08/14/2020 1405   K 4.2 08/14/2020 1405   CL 105 08/14/2020 1405   CO2 25 08/14/2020 1405   GLUCOSE 100 (H) 08/14/2020 1405   BUN 13 08/14/2020 1405   CREATININE 1.12 (H) 08/14/2020 1405   CALCIUM 9.5 08/14/2020 1405   GFRNONAA 48 (L) 08/14/2020 1405   GFRAA 55 (L) 08/14/2020 1405    Assessment & Plan:   Discussion: 75 year old female with COPD, pAF, hypothyroidism, CKD stage III who presents for follow-up. COPD/bronchitis exacerbation in 08/2021. Completed steroid taper with improvement. No longer on bronchodilators. Back to baseline activity.  We discussed bronchodilator management and clinical course of her COPD/Bronchitis. After discussion, will hold on ICS inhaler since she is asymptomatic.   COPD/Bronchitis When ill, START Budesonide 80-4.5 mcg TWO puffs TWICE a day CONTINUE Albuterol AS NEEDED for shortness of  breath or wheezing Discussed COVID and influenza vaccine  Health Maintenance Immunization History  Administered Date(s) Administered   Fluad Quad(high Dose 65+) 09/02/2021   Influenza Split 08/26/2011, 08/03/2013   Influenza Whole 09/11/2010   Influenza, High Dose Seasonal PF 07/11/2012, 08/19/2014, 08/06/2015, 08/22/2018, 07/25/2019   Influenza,inj,quad, With Preservative 09/24/2020   PFIZER(Purple Top)SARS-COV-2 Vaccination 11/16/2019, 12/07/2019, 07/25/2020, 02/09/2021   Pneumococcal Conjugate-13 08/08/2014   Pneumococcal Polysaccharide-23 05/11/2010, 08/13/2012   Zoster, Live 05/08/2010, 11/23/2010   CT Lung Screen - not indicated  No orders of the defined types were placed in this encounter.  No orders of the defined types were placed in this encounter.  Return in about 4 months (around 01/21/2022).  I have spent a total time of 36-minutes on the day of the appointment reviewing prior documentation, coordinating care and discussing medical diagnosis and plan with the patient/family. Past  medical history, allergies, medications were reviewed. Pertinent imaging, labs and tests included in this note have been reviewed and interpreted independently by me.  [name] Heslin Mechele Collin, MD  Pulmonary Critical Care 09/23/2021 5:03 PM  Office Number [phone]

## 2021-09-23 NOTE — Patient Instructions (Signed)
COPD/Bronchitis When ill, START Budesonide 80-4.5 mcg TWO puffs TWICE a day CONTINUE Albuterol AS NEEDED for shortness of breath or wheezing Discussed COVID and influenza vaccine  Follow-up with me in 4 months

## 2021-10-20 ENCOUNTER — Telehealth: Payer: Self-pay | Admitting: Pulmonary Disease

## 2021-10-20 NOTE — Telephone Encounter (Signed)
Called patient but she did not answer. Left message for her to call us back.  

## 2021-10-20 NOTE — Telephone Encounter (Signed)
[facility] Telephone Encounter  She reports congestion, productive cough and wheezing that is worse at night.  She is taking over the counter flu and cold medicine but continues to have symptoms.  START budesonide TWO puffs TWICE a day for wheezing START Sudafed 30 mg every 6 hours during the daytime for congestion relief  Mechele Collin, M.D. [facility] 10/20/2021 2:28 PM

## 2021-10-20 NOTE — Telephone Encounter (Signed)
Spoke with the pt  She states she has had a "bad cold" for the past 9 days  She has been having runny nose, cough with large amounts of yellow sputum, and wheezing at night  She uses her albuterol 2-3 x per day and it helps some  She has not tried using her symbicort since she feels she just has a cold and this would not help her  She states she has been fully vaccinated against flu and covid and has not taken covid test  She states that she has had covid before and this feels more like just a cold  Unfortunately no appt's open this week with limited providers in the office  Please advise thanks!  Allergies  Allergen Reactions   Shellfish Allergy Swelling   Prednisone Other (See Comments)    Face turns red   Sulfonamide Derivatives Nausea Only

## 2021-11-04 ENCOUNTER — Encounter: Payer: Self-pay | Admitting: Internal Medicine

## 2021-11-18 ENCOUNTER — Telehealth: Payer: Self-pay | Admitting: Pulmonary Disease

## 2021-11-18 ENCOUNTER — Encounter: Payer: Self-pay | Admitting: Emergency Medicine

## 2021-11-18 ENCOUNTER — Telehealth (INDEPENDENT_AMBULATORY_CARE_PROVIDER_SITE_OTHER): Payer: Medicare Other | Admitting: Emergency Medicine

## 2021-11-18 ENCOUNTER — Telehealth: Payer: Self-pay

## 2021-11-18 DIAGNOSIS — R053 Chronic cough: Secondary | ICD-10-CM

## 2021-11-18 MED ORDER — PREDNISONE 10 MG PO TABS: ORAL_TABLET | ORAL | 0 refills | Status: DC

## 2021-11-18 NOTE — Progress Notes (Signed)
Virtual Visit via Telephone Note  I connected with [name] [name] on 11/18/21 at  4:00 PM EST by telephone and verified that I am speaking with the correct person using two identifiers.  Location: Patient: home Provider: office   I discussed the limitations, risks, security and privacy concerns of performing an evaluation and management service by telephone and the availability of in person appointments. I also discussed with the patient that there may be a patient responsible charge related to this service. The patient expressed understanding and agreed to proceed.   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  Currently managed on Symbicort 80/4.5 mcg.   Observations/Objective: She was well until around mid-Dec when she developed URI sx. Developed sore throat, progressive prod cough with clear mucous. She is using symbicort 80 without any relief. Also sudafed, mucinex DM. She has tried albuterol with some temporary relief.   Assessment and Plan: Acute on chronic cough, she describes as UA in nature. Now with acute flaring after URI.  - pred taper - will have to decide going forward whether symbicort worth continuing. I will continue it for now.  - albuterol prn - may need to ramp up GERD control, rhinitis control or both  Follow Up Instructions: OV with JE or APP in 2 weeks to assess status.    I discussed the assessment and treatment plan with the patient. The patient was provided an opportunity to ask questions and all were answered. The patient agreed with the plan and demonstrated an understanding of the instructions.   The patient was advised to call back or seek an in-person evaluation if the symptoms worsen or if the condition fails to improve as anticipated.  I provided 15 minutes of non-face-to-face time during this encounter.   Collene Gobble, MD

## 2021-11-18 NOTE — Telephone Encounter (Signed)
Called and spoke with pt letting her know recs per SG and she verbalized understanding. Acute video visit scheduled with RB at 4pm. Nothing further needed.

## 2021-11-18 NOTE — Telephone Encounter (Signed)
Called patient and she states that with her coughing spells she is noting wheezing. Patient states that she is using her inhaler but it does not give her any relief and that this has been occurring for 5 days. Patient states that she is coughing up yellow mucus.   Sarah please advise since Loanne Drilling is off

## 2021-11-18 NOTE — Telephone Encounter (Signed)
Pt needs to f/u with Dr. Loanne Drilling or APP in 2 weeks. Please schedule pt

## 2021-11-20 NOTE — Telephone Encounter (Signed)
Pt scheduled with TP 12/02/2021 at 11am. Nothing further needed.

## 2021-12-01 ENCOUNTER — Encounter: Payer: Self-pay | Admitting: Adult Health

## 2021-12-01 ENCOUNTER — Ambulatory Visit (INDEPENDENT_AMBULATORY_CARE_PROVIDER_SITE_OTHER): Payer: Medicare Other | Admitting: Adult Health

## 2021-12-01 ENCOUNTER — Other Ambulatory Visit: Payer: Self-pay

## 2021-12-01 ENCOUNTER — Ambulatory Visit (INDEPENDENT_AMBULATORY_CARE_PROVIDER_SITE_OTHER): Payer: Medicare Other

## 2021-12-01 VITALS — BP 108/58 | HR 94 | Temp 98.2°F | Ht 68.0 in | Wt 179.6 lb

## 2021-12-01 DIAGNOSIS — J4530 Mild persistent asthma, uncomplicated: Secondary | ICD-10-CM

## 2021-12-01 DIAGNOSIS — R059 Cough, unspecified: Secondary | ICD-10-CM | POA: Diagnosis not present

## 2021-12-01 DIAGNOSIS — R053 Chronic cough: Secondary | ICD-10-CM

## 2021-12-01 DIAGNOSIS — J45909 Unspecified asthma, uncomplicated: Secondary | ICD-10-CM | POA: Insufficient documentation

## 2021-12-01 NOTE — Assessment & Plan Note (Signed)
Mild asthma with slow to resolve flare.  Will control for triggers.  Including chronic rhinitis cough and GERD. Change to Allegra.  Add in chlor tabs at bedtime.  Add Pepcid at bedtime.  Cough control with Delsym and Tessalon Check chest x-ray today.  Plan  Patient Instructions  Begin Allegra 180mg  daily for 1 month and As needed   Begin Pepcid 20mg  At bedtime  for 1 month and then as needed  Begin Chlorpheniramine 4mg  At bedtime for 2 weeks and then as needed -for drainage .  Delsym 2 tsp Twice daily  As needed  cough .  Sips of water to soothe throat and avoid throat clearing /cough  NO MINTS Chest xray today  If hoarseness does not resolve in 2 weeks, call back and will refer to ENT .  Follow up with Dr. in 3 months and As needed   Please contact office for sooner follow up if symptoms do not improve or worsen or seek emergency care

## 2021-12-01 NOTE — Progress Notes (Signed)
@Patient  ID: [name] [name], female    DOB: Oct 06, 1946, 76 y.o.   MRN: [id number]  Chief Complaint  Patient presents with   Follow-up    Referring provider: Lawerance Cruel, MD  HPI: 76 yo female followed for asthma and cough  Medical history significant for A Fib and chronic kidney disease   TEST/EVENTS :  09/21/10 FVC 3.25 (93%) FEV1 2.3 (90%) Ratio 68  TLC 92% DLCO 101% Interpretation: Mild obstructive defect is present. FEV1/FVC ratio <70%.  12/01/2021 Follow up ; Asthma/Cough  Patient presents for a 2-week follow-up.  Patient was seen last visit via video for an acute asthma exacerbation.  She was given a prednisone taper.  Patient says she is better.  Cough and wheezing has decreased.  Patient continues to have some intermittent dry cough, hoarseness, throat clearing and postnasal drainage.  Patient says she has tried inhalers including Symbicort in the past without significant improvement.  Uses albuterol on occasion.  Patient denies any fever, hemoptysis, discolored mucus, unintentional weight loss. Currently not using any medications for cough or drainage.  Has uses Zyrtec with some relief in the past.  Says that Zyrtec works for a while but wears off and especially does not work much at night.   Allergies  Allergen Reactions   Shellfish Allergy Swelling   Prednisone Other (See Comments)    Face turns red   Sulfonamide Derivatives Nausea Only    Immunization History  Administered Date(s) Administered   Fluad Quad(high Dose 65+) 09/02/2021   Influenza Split 08/26/2011, 08/03/2013   Influenza Whole 09/11/2010   Influenza, High Dose Seasonal PF 07/11/2012, 08/19/2014, 08/06/2015, 08/22/2018, 07/25/2019   Influenza,inj,quad, With Preservative 09/24/2020   PFIZER(Purple Top)SARS-COV-2 Vaccination 11/16/2019, 12/07/2019, 07/25/2020, 02/09/2021   Pneumococcal Conjugate-13 08/08/2014   Pneumococcal Polysaccharide-23 05/11/2010, 08/13/2012   Zoster, Live 05/08/2010,  11/23/2010    Past Medical History:  Diagnosis Date   High cholesterol    Hyperlipemia    Renal disorder    Thyroid disease     Tobacco History: Social History   Tobacco Use  Smoking Status Former   Years: 1.00   Types: Cigarettes   Start date: 10/1961   Quit date: 09/1962   Years since quitting: 59.2  Smokeless Tobacco Never  Tobacco Comments   passive cigarette smoke exposure, smoked in high school   Counseling given: Not Answered Tobacco comments: passive cigarette smoke exposure, smoked in high school   Outpatient Medications Prior to Visit  Medication Sig Dispense Refill   albuterol (VENTOLIN HFA) 108 (90 Base) MCG/ACT inhaler Inhale 2 puffs into the lungs every 6 (six) hours as needed for wheezing or shortness of breath. 8 g 6   budesonide-formoterol (SYMBICORT) 80-4.5 MCG/ACT inhaler Inhale 2 puffs into the lungs 2 (two) times daily. 1 each 5   levothyroxine (SYNTHROID) 88 MCG tablet Take 88 mcg by mouth daily.     predniSONE (DELTASONE) 10 MG tablet Take 4 tablets X 3 days, 3 tabs X 3 days, 2 tabs x 3 days, 1 tab x 3 days 30 tablet 0   rosuvastatin (CRESTOR) 5 MG tablet Take 1 tablet (5 mg total) by mouth daily. 90 tablet 3   sertraline (ZOLOFT) 50 MG tablet Take 50 mg by mouth daily.      aspirin EC 81 MG tablet Take 1 tablet (81 mg total) by mouth daily. Swallow whole. 90 tablet 3   nitroGLYCERIN (NITROSTAT) 0.4 MG SL tablet Place 1 tablet (0.4 mg total) under the tongue every 5 (  five) minutes as needed for chest pain. 90 tablet 3   No facility-administered medications prior to visit.     Review of Systems:   Constitutional:   No  weight loss, night sweats,  Fevers, chills, fatigue, or  lassitude.  HEENT:   No headaches,  Difficulty swallowing,  Tooth/dental problems, or  Sore throat,                No sneezing, itching, ear ache,  +nasal congestion, post nasal drip, hoarseness   CV:  No chest pain,  Orthopnea, PND, swelling in lower extremities,  anasarca, dizziness, palpitations, syncope.   GI  No heartburn, indigestion, abdominal pain, nausea, vomiting, diarrhea, change in bowel habits, loss of appetite, bloody stools.   Resp: .  No chest wall deformity  Skin: no rash or lesions.  GU: no dysuria, change in color of urine, no urgency or frequency.  No flank pain, no hematuria   MS:  No joint pain or swelling.  No decreased range of motion.  No back pain.    Physical Exam  BP (!) 108/58 (BP Location: Left Arm, Patient Position: Sitting, Cuff Size: Large)    Pulse 94    Temp 98.2 F (36.8 C) (Oral)    Ht 5\' 8"  (1.727 m)    Wt 179 lb 9.6 oz (81.5 kg)    SpO2 98%    BMI 27.31 kg/m   GEN: A/Ox3; pleasant , NAD, well nourished    HEENT:  [place]/AT,    NOSE-clear drainage , THROAT-clear, no lesions, no postnasal drip or exudate noted.   NECK:  Supple w/ fair ROM; no JVD; normal carotid impulses w/o bruits; no thyromegaly or nodules palpated; no lymphadenopathy.    RESP  Clear  P & A; w/o, wheezes/ rales/ or rhonchi. no accessory muscle use, no dullness to percussion  CARD:  RRR, no m/r/g, no peripheral edema, pulses intact, no cyanosis or clubbing.  GI:   Soft & nt; nml bowel sounds; no organomegaly or masses detected.   Musco: Warm bil, no deformities or joint swelling noted.   Neuro: alert, no focal deficits noted.    Skin: Warm, no lesions or rashes    Lab Results:  CBC No results found for: WBC, RBC, HGB, HCT, PLT, MCV, MCH, MCHC, RDW, LYMPHSABS, MONOABS, EOSABS, BASOSABS  BMET   BNP No results found for: BNP  ProBNP No results found for: PROBNP  Imaging: No results found.    No flowsheet data found.  No results found for: NITRICOXIDE      Assessment & Plan:   Asthma Mild asthma with slow to resolve flare.  Will control for triggers.  Including chronic rhinitis cough and GERD. Change to Allegra.  Add in chlor tabs at bedtime.  Add Pepcid at bedtime.  Cough control with Delsym and  Tessalon Check chest x-ray today.  Plan  Patient Instructions  Begin Allegra 180mg  daily for 1 month and As needed   Begin Pepcid 20mg  At bedtime  for 1 month and then as needed  Begin Chlorpheniramine 4mg  At bedtime for 2 weeks and then as needed -for drainage .  Delsym 2 tsp Twice daily  As needed  cough .  Sips of water to soothe throat and avoid throat clearing /cough  NO MINTS Chest xray today  If hoarseness does not resolve in 2 weeks, call back and will refer to ENT .  Follow up with Dr. Loanne Drilling in 3 months and As needed   Please contact  office for sooner follow up if symptoms do not improve or worsen or seek emergency care          Rexene Edison, NP 12/01/2021

## 2021-12-01 NOTE — Patient Instructions (Signed)
Begin Allegra 180mg  daily for 1 month and As needed   Begin Pepcid 20mg  At bedtime  for 1 month and then as needed  Begin Chlorpheniramine 4mg  At bedtime for 2 weeks and then as needed -for drainage .  Delsym 2 tsp Twice daily  As needed  cough .  Sips of water to soothe throat and avoid throat clearing /cough  NO MINTS Chest xray today  If hoarseness does not resolve in 2 weeks, call back and will refer to ENT .  Follow up with Dr. in 3 months and As needed   Please contact office for sooner follow up if symptoms do not improve or worsen or seek emergency care

## 2021-12-02 ENCOUNTER — Ambulatory Visit: Payer: Medicare Other | Admitting: Adult Health

## 2021-12-02 ENCOUNTER — Ambulatory Visit (INDEPENDENT_AMBULATORY_CARE_PROVIDER_SITE_OTHER): Payer: Medicare Other | Admitting: Internal Medicine

## 2021-12-02 ENCOUNTER — Encounter: Payer: Self-pay | Admitting: Internal Medicine

## 2021-12-02 VITALS — BP 100/58 | HR 88 | Ht 68.0 in | Wt 180.4 lb

## 2021-12-02 DIAGNOSIS — K52832 Lymphocytic colitis: Secondary | ICD-10-CM | POA: Diagnosis not present

## 2021-12-02 NOTE — Progress Notes (Addendum)
HISTORY OF PRESENT ILLNESS:  [name] [name] is a 76 y.o. female, remotely known to this practice, who presents today to reestablish care with regard to a previous diagnosis of lymphocytic colitis.  The patient underwent colonoscopy here in 2005 with Dr. Velora Heckler.  She was found to have diverticulosis and a colon polyp.  In 2013 she moved to [facility].  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  She did have a colonoscopy in 2018 including random colon biopsies which revealed changes consistent with lymphocytic colitis.  She was treated with budesonide, which worked.  She tells me that approximately 4-5 times per year she will experience problems with diarrhea.  She will take 3 to 6 mg of budesonide for several days.  The problem resolves.  She returned to [facility] in June 2021.  Her last such episode was about a month ago.  Her only other complaint is hemorrhoids with occasional bleeding.  GI review of systems is otherwise negative.  Outside records have been requested for review.  Records from her PCP at [place], Dr. Harrington Challenger, have been reviewed.  REVIEW OF SYSTEMS:  All non-GI ROS negative unless otherwise stated in the HPI except for arthritis, back pain, cough, hearing problems, shortness of breath  Past Medical History:  Diagnosis Date   Arthritis    Asthma    Atrial fibrillation (HCC)    C. difficile diarrhea    COPD (chronic obstructive pulmonary disease) ([place])    High cholesterol    Hyperlipemia    IBS (irritable bowel syndrome)    Lymphocytic colitis    Renal disorder    Thyroid disease     Past Surgical History:  Procedure Laterality Date   ROTATOR CUFF REPAIR Right 2014   TONSILLECTOMY      Social History EMIA MIGUES  reports that she quit smoking about 59 years ago. Her smoking use included cigarettes. She started smoking about 60 years ago. She has never used smokeless tobacco. She reports current  alcohol use. She reports that she does not use drugs.  family history includes Emphysema in her mother; Heart disease in her father; Lupus in her son; Rheum arthritis in her maternal grandmother and son; Sjogren's syndrome in her son.  Allergies  Allergen Reactions   Shellfish Allergy Swelling   Prednisone Other (See Comments)    Face turns red   Sulfonamide Derivatives Nausea Only       PHYSICAL EXAMINATION: Vital signs: BP (!) 100/58 (BP Location: Left Arm, Patient Position: Sitting, Cuff Size: Normal)    Pulse 88    Ht 5\' 8"  (1.727 m) Comment: height measured without shoes   Wt 180 lb 6 oz (81.8 kg)    BMI 27.43 kg/m   Constitutional: Pleasant, generally well-appearing, no acute distress Psychiatric: alert and oriented x3, cooperative Eyes: extraocular movements intact, anicteric, conjunctiva pink Mouth: oral pharynx moist, no lesions Neck: supple no lymphadenopathy Cardiovascular: heart regular rate and rhythm, no murmur Lungs: clear to auscultation bilaterally Abdomen: soft, nontender, nondistended, no obvious ascites, no peritoneal signs, normal bowel sounds, no organomegaly Rectal: Omitted Extremities: no clubbing, cyanosis, or lower extremity edema bilaterally Skin: no lesions on visible extremities Neuro: No focal deficits.  Cranial nerves intact  ASSESSMENT:  1.  Biopsy-proven lymphocytic colitis 2018 in [facility].  Has been treated with budesonide on demand. 2.  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  Diverticulosis 4.  General medical  problems.  Stable 5.  Hemorrhoids  PLAN:  1.  Refill budesonide as needed 2.  Samples of medicated hemorrhoid cream provided 3.  Routine office follow-up 6 months.  Contact the office in the interim for any questions or problems Total time of 45 minutes was spent preparing to see the patient, reviewing test, obtaining comprehensive history,  reviewing outside records, performing comprehensive physical examination, counseling the patient regarding her above listed issues, directing medical therapy, arranging follow-up, and documenting clinical information in the health record  ADDENDUM 12-18-2021 (Records from [facility] ,[place])  Colonoscopy 10/20/2017. Diverticulosis. Otherwise normal. BIOPSIES SHOW LYMPHOCYTIC COLITIS EGD 10/20/2017. Mild inflammation at GEJ. Otherwise normal. Biopsies GEJ with mild inflammation. ENTEROSCOPY with FECAL TRANSPLANT. 01/11/2018

## 2021-12-02 NOTE — Patient Instructions (Signed)
If you are age 78 or older, your body mass index should be between 23-30. Your Body mass index is 27.43 kg/m. If this is out of the aforementioned range listed, please consider follow up with your Primary Care Provider.  If you are age 54 or younger, your body mass index should be between 19-25. Your Body mass index is 27.43 kg/m. If this is out of the aformentioned range listed, please consider follow up with your Primary Care Provider.   ________________________________________________________  The [facility] providers would like to encourage you to use [facility] to communicate with providers for non-urgent requests or questions.  Due to long hold times on the telephone, sending your provider a message by [facility] may be a faster and more efficient way to get a response.  Please allow 48 business hours for a response.  Please remember that this is for non-urgent requests.  _______________________________________________________   Please follow up in 6 months

## 2021-12-21 NOTE — Progress Notes (Signed)
@Patient  ID: [name] [name], female    DOB: 01-Jun-1946, 76 y.o.   MRN: [id number]  Chief Complaint  Patient presents with   Follow-up    Cough and wheezing    Referring provider: Lawerance Cruel, MD  HPI: 76 year old female, former remote smoker followed for chronic cough and asthma. She is a patient of Dr. Agustina Caroli and last seen in office on 12/01/2021 by Parrett,NP. Past medical history significant for CAD, PAF, colitis, hypothyroid, osteopenia, arthritis, CKD, HLD, vertigo.  TEST/EVENTS:  09/21/2010 PFTs: FVC 3.25 (93%), FEV1 2.3 (90%), ratio 68, TLC 92%, DLCO 101%. Mild obstructive defect present 12/21/2021 CXR 2 view: Lungs hyperinflated without any evidence of acute process/superimposed infection.   12/01/2021: OV with Parrett NP. Seen 2 weeks prior over video visit for acute asthma exacerbation. Treated with prednisone taper. Reported feeling better at OV with decreased cough and wheezing. Continued to have some hoarseness, throat clearing and postnasal drainage; occasional dry cough. Never had significant success with inhalers in past. Suspected slow to resolve asthma flare - control for triggers with change to Allegra, chrlotab at bed, pecpid, delsym and tessalon. CXR with hyperinflation otherwise clear lungs.   12/22/2021: Today - acute visit Patient presents today for worsening cough and new wheeze. She was seen in November and again in late January and treated for a COPD/asthma exacerbation, both requiring prednisone. She she had improved when she was seen earlier this month but still had some persistent cough.  She was treated with measures to control triggers.  CXR was without any acute process.  She reports today that her cough never fully resolved and since Friday her cough has been more persistent and violent and she now has been wheezing.  Her cough is primarily dry however occasionally she will cough up thick, cream-colored mucus.  This is rare and usually occurs at night.   Her cough has been keeping her up at night despite using Tessalon Perles, Mucinex DM and Chlortab at night.  She has had associated fatigue since she has not been able to sleep well.  She also notes some persistent nasal drainage, primarily from the left nostril.  Notes that the drainage is clear.  She denies any orthopnea, chest pain, hemoptysis, lower extremity edema.  She has not been using her Symbicort regularly as she feels as though it does not work well for her.  She does use her albuterol frequently.  She is also taking Allegra and Pepcid daily.  ACT 8 FeNO 64 ppb  Allergies  Allergen Reactions   Shellfish Allergy Swelling   Prednisone Other (See Comments)    Face turns red   Sulfonamide Derivatives Nausea Only    Immunization History  Administered Date(s) Administered   Fluad Quad(high Dose 65+) 09/02/2021   Influenza Split 08/26/2011, 08/03/2013   Influenza Whole 09/11/2010   Influenza, High Dose Seasonal PF 07/11/2012, 08/19/2014, 08/06/2015, 08/22/2018, 07/25/2019   Influenza,inj,quad, With Preservative 09/24/2020   PFIZER(Purple Top)SARS-COV-2 Vaccination 11/16/2019, 12/07/2019, 07/25/2020, 02/09/2021   Pneumococcal Conjugate-13 08/08/2014   Pneumococcal Polysaccharide-23 05/11/2010, 08/13/2012   Zoster, Live 05/08/2010, 11/23/2010    Past Medical History:  Diagnosis Date   Arthritis    Asthma    Atrial fibrillation (HCC)    C. difficile diarrhea    COPD (chronic obstructive pulmonary disease) (HCC)    High cholesterol    Hyperlipemia    IBS (irritable bowel syndrome)    Lymphocytic colitis    Renal disorder    Thyroid disease  Tobacco History: Social History   Tobacco Use  Smoking Status Former   Years: 1.00   Types: Cigarettes   Start date: 10/1961   Quit date: 09/1962   Years since quitting: 59.2  Smokeless Tobacco Never  Tobacco Comments   passive cigarette smoke exposure, smoked in high school   Counseling given: Not Answered Tobacco  comments: passive cigarette smoke exposure, smoked in high school   Outpatient Medications Prior to Visit  Medication Sig Dispense Refill   acetaminophen (TYLENOL) 325 MG tablet Take 1-3 tablets by mouth as needed.     albuterol (VENTOLIN HFA) 108 (90 Base) MCG/ACT inhaler Inhale 2 puffs into the lungs every 6 (six) hours as needed for wheezing or shortness of breath. 8 g 6   Ascorbic Acid (VITAMIN C PO) Take 1 tablet by mouth daily.     aspirin EC 81 MG tablet Take 1 tablet (81 mg total) by mouth daily. Swallow whole. (Patient taking differently: Take 81 mg by mouth as needed. Swallow whole.) 90 tablet 3   B Complex-C (SUPER B COMPLEX PO) Take 1 tablet by mouth daily.     budesonide (ENTOCORT EC) 3 MG 24 hr capsule Take 1-2 capsules by mouth as needed.     Cholecalciferol (VITAMIN D3) 10 MCG (400 UNIT) tablet Take 1 tablet by mouth daily.     famotidine (PEPCID) 20 MG tablet Take 20 mg by mouth at bedtime.     fexofenadine (ALLEGRA) 180 MG tablet Take 180 mg by mouth daily.     levothyroxine (SYNTHROID) 88 MCG tablet Take 88 mcg by mouth daily.     meclizine (ANTIVERT) 50 MG tablet Take 1 tablet by mouth as needed.     Melatonin 5 MG CAPS Take 1 tablet by mouth at bedtime.     rosuvastatin (CRESTOR) 5 MG tablet Take 1 tablet (5 mg total) by mouth daily. 90 tablet 3   sertraline (ZOLOFT) 50 MG tablet Take 50 mg by mouth daily.      budesonide-formoterol (SYMBICORT) 80-4.5 MCG/ACT inhaler Inhale 2 puffs into the lungs 2 (two) times daily. (Patient not taking: Reported on 12/22/2021) 1 each 5   nitroGLYCERIN (NITROSTAT) 0.4 MG SL tablet Place 1 tablet (0.4 mg total) under the tongue every 5 (five) minutes as needed for chest pain. (Patient not taking: Reported on 12/02/2021) 90 tablet 3   predniSONE (DELTASONE) 10 MG tablet Take 4 tablets X 3 days, 3 tabs X 3 days, 2 tabs x 3 days, 1 tab x 3 days (Patient not taking: Reported on 12/22/2021) 30 tablet 0   No facility-administered medications prior to  visit.     Review of Systems:   Constitutional: No weight loss or gain, night sweats, fevers, chills. +fatigue HEENT: No headaches, difficulty swallowing, tooth/dental problems, or sore throat. No sneezing, itching, ear ache. +nasal congestion, clear rhinorrhea CV:  No chest pain, orthopnea, PND, swelling in lower extremities, anasarca, dizziness, palpitations, syncope Resp: +shortness of breath with coughing spells; primarily non-productive, dry cough; wheezing. No excess mucus or change in color of mucus. No hemoptysis.  No chest wall deformity GI:  No heartburn, indigestion, abdominal pain, nausea, vomiting, diarrhea, change in bowel habits, loss of appetite, bloody stools.  GU: No dysuria, change in color of urine, urgency or frequency.  No flank pain, no hematuria  Skin: No rash, lesions, ulcerations MSK:  No joint pain or swelling.  No decreased range of motion.  No back pain. Neuro: No dizziness or lightheadedness.  Psych: No depression  or anxiety. Mood stable.     Physical Exam:  BP 122/62 (BP Location: Right Arm, Cuff Size: Normal)    Pulse 93    Temp 98.1 F (36.7 C) (Oral)    Ht 5\' 8"  (1.727 m)    Wt 181 lb 3.2 oz (82.2 kg)    SpO2 96%    BMI 27.55 kg/m   GEN: Pleasant, interactive, well-nourished; in no acute distress. HEENT:  Normocephalic and atraumatic. EACs patent bilaterally. TM pearly gray with present light reflex bilaterally. PERRLA. Sclera white. Nasal turbinates pink, moist and patent bilaterally. No rhinorrhea present. Oropharynx erythematous and moist, without exudate or edema. No lesions, ulcerations, or postnasal drip.  NECK:  Supple w/ fair ROM. No JVD present. Normal carotid impulses w/o bruits. Thyroid symmetrical with no goiter or nodules palpated. No lymphadenopathy.   CV: RRR, no m/r/g, no peripheral edema. Pulses intact, +2 bilaterally. No cyanosis, pallor or clubbing. PULMONARY:  Unlabored, regular breathing. Scattered expiratory wheezes bilaterally A&P.  Persistent dry, hacking cough during exam. Wheezes mostly resolved, minimal residual, after albuterol neb. No accessory muscle use. No dullness to percussion. GI: BS present and normoactive. Soft, non-tender to palpation. No organomegaly or masses detected. No CVA tenderness. MSK: No erythema, warmth or tenderness. Cap refil <2 sec all extrem. No deformities or joint swelling noted.  Neuro: A/Ox3. No focal deficits noted.   Skin: Warm, no lesions or rashe Psych: Normal affect and behavior. Judgement and thought content appropriate.     Lab Results:  CBC No results found for: WBC, RBC, HGB, HCT, PLT, MCV, MCH, MCHC, RDW, LYMPHSABS, MONOABS, EOSABS, BASOSABS  BMET    Component Value Date/Time   NA 141 08/14/2020 1405   K 4.2 08/14/2020 1405   CL 105 08/14/2020 1405   CO2 25 08/14/2020 1405   GLUCOSE 100 (H) 08/14/2020 1405   BUN 13 08/14/2020 1405   CREATININE 1.12 (H) 08/14/2020 1405   CALCIUM 9.5 08/14/2020 1405   GFRNONAA 48 (L) 08/14/2020 1405   GFRAA 55 (L) 08/14/2020 1405    BNP No results found for: BNP   Imaging:  DG Chest 2 View  Result Date: 12/02/2021 CLINICAL DATA:  Cough for 2 months EXAM: CHEST - 2 VIEW COMPARISON:  October 22, 2011 FINDINGS: No pneumothorax. The cardiomediastinal silhouette is stable. The lungs are hyperinflated but stable. No pulmonary nodules or masses. No focal infiltrates. No overt edema. IMPRESSION: 1. Hyperinflation of the lungs is stable. No other abnormalities identified. Electronically Signed   By: Dorise Bullion III M.D.   On: 12/02/2021 06:23    methylPREDNISolone acetate (DEPO-MEDROL) injection 80 mg     Date Action Dose Route User   12/22/2021 0957 Given 80 mg Intramuscular (Right Ventrogluteal) Darliss Ridgel, CMA       No flowsheet data found.  No results found for: NITRICOXIDE      Assessment & Plan:   Asthma exacerbation in COPD [facility]) This is her third exacerbation in the last 3 to 4 months requiring  prednisone.  FeNO was elevated today to 64 ppb. Has felt like Symbicort doesn't help as much as albuterol. We discussed the importance of a maintenance inhaler, especially given her frequent flares. Advised her to restart Symbicort 2 puffs Twice daily and use with spacer. Continue to use albuterol PRN. Will check CBC with diff and IgE today to assess eos and for allergic phenotype. Depo 80 mg inj x1 today. Albuterol neb with improvement in lung sounds and cough. Prednisone taper. Close follow  up.  Patient Instructions  -Continue Albuterol inhaler 2 puffs or 3 mL neb every 6 hours as needed for shortness of breath or wheezing. Notify if symptoms persist despite rescue inhaler/neb use. -Continue pepcid 20 mg At bedtime  -Continue Allegra 180 mg daily -Restart Symbicort 2 puffs Twice daily. Brush tongue and rinse mouth out well afterwards. Use with spacer -Continue Tessalon Perles 200 mg every 8 hours as needed for cough -Continue Mucinex DM 600 mg Twice daily for cough/chest congestion   -Prednisone taper. 4 tabs for 2 days, then 3 tabs for 2 days, 2 tabs for 2 days, then 1 tab for 2 days, then stop. Take in AM with food. Start tomorrow as we gave you a steroid shot today. -Flonase nasal spray 2 sprays each nostril daily -Tussionex cough syrup 5 mL At bedtime as needed for cough. May cause drowsiness and unsteadiness. Do not drive after taking.  Labs today - CBC with diff, IgE  Follow up in 2 weeks with Dr. Loanne Drilling or [place]. If symptoms do not improve or worsen, please contact office for sooner follow up with chest x ray or seek emergency care.    Upper airway cough syndrome Does appear/sound to have an upper airway component to her cough as well. GERD control with pepcid. Optimize cough control - advised to use tessalon perles consistently throughout day and Mucinex DM; tussionex rx At bedtime. PDMP reviewed.   Paroxysmal atrial fibrillation (HCC) Regular rhythm and rate at visit. No  acute cardiac symptoms. Follow up with cardiology as scheduled.  GERD (gastroesophageal reflux disease) Feels as though it is well-controlled on current regimen. Could consider step up to PPI if upper airway syndrome persists despite optimal asthma control.    I spent 35 minutes of dedicated to the care of this patient on the date of this encounter to include pre-visit review of records, face-to-face time with the patient discussing conditions above, post visit ordering of testing, clinical documentation with the electronic health record, making appropriate referrals as documented, and communicating necessary findings to members of the patients care team.  Clayton Bibles, NP 12/22/2021  Pt aware and understands NP's role.

## 2021-12-22 ENCOUNTER — Other Ambulatory Visit: Payer: Self-pay

## 2021-12-22 ENCOUNTER — Encounter: Payer: Self-pay | Admitting: Nurse Practitioner

## 2021-12-22 ENCOUNTER — Ambulatory Visit (INDEPENDENT_AMBULATORY_CARE_PROVIDER_SITE_OTHER): Payer: Medicare Other | Admitting: Nurse Practitioner

## 2021-12-22 VITALS — BP 122/62 | HR 93 | Temp 98.1°F | Ht 68.0 in | Wt 181.2 lb

## 2021-12-22 DIAGNOSIS — K219 Gastro-esophageal reflux disease without esophagitis: Secondary | ICD-10-CM | POA: Diagnosis not present

## 2021-12-22 DIAGNOSIS — J45901 Unspecified asthma with (acute) exacerbation: Secondary | ICD-10-CM | POA: Insufficient documentation

## 2021-12-22 DIAGNOSIS — J441 Chronic obstructive pulmonary disease with (acute) exacerbation: Secondary | ICD-10-CM

## 2021-12-22 DIAGNOSIS — R058 Other specified cough: Secondary | ICD-10-CM | POA: Insufficient documentation

## 2021-12-22 DIAGNOSIS — I480 Paroxysmal atrial fibrillation: Secondary | ICD-10-CM | POA: Diagnosis not present

## 2021-12-22 LAB — CBC WITH DIFFERENTIAL/PLATELET
Basophils Absolute: 0 10*3/uL (ref 0.0–0.1)
Basophils Relative: 0.5 % (ref 0.0–3.0)
Eosinophils Absolute: 0.5 10*3/uL (ref 0.0–0.7)
Eosinophils Relative: 7.4 % — ABNORMAL HIGH (ref 0.0–5.0)
HCT: 40.4 % (ref 36.0–46.0)
Hemoglobin: 13.5 g/dL (ref 12.0–15.0)
Lymphocytes Relative: 20.7 % (ref 12.0–46.0)
Lymphs Abs: 1.4 10*3/uL (ref 0.7–4.0)
MCHC: 33.4 g/dL (ref 30.0–36.0)
MCV: 94.5 fl (ref 78.0–100.0)
Monocytes Absolute: 0.6 10*3/uL (ref 0.1–1.0)
Monocytes Relative: 8.9 % (ref 3.0–12.0)
Neutro Abs: 4.1 10*3/uL (ref 1.4–7.7)
Neutrophils Relative %: 62.5 % (ref 43.0–77.0)
Platelets: 224 10*3/uL (ref 150.0–400.0)
RBC: 4.27 Mil/uL (ref 3.87–5.11)
RDW: 12.5 % (ref 11.5–15.5)
WBC: 6.6 10*3/uL (ref 4.0–10.5)

## 2021-12-22 LAB — NITRIC OXIDE: FeNO level (ppb): 64

## 2021-12-22 MED ORDER — BENZONATATE 200 MG PO CAPS
200.0000 mg | ORAL_CAPSULE | Freq: Three times a day (TID) | ORAL | 1 refills | Status: DC | PRN
Start: 2021-12-22 — End: 2022-06-14

## 2021-12-22 MED ORDER — FLUTICASONE PROPIONATE 50 MCG/ACT NA SUSP: 2.0000 | Freq: Every day | NASAL | 2 refills | Status: DC

## 2021-12-22 MED ORDER — HYDROCOD POLI-CHLORPHE POLI ER 10-8 MG/5ML PO SUER: 5.0000 mL | Freq: Every evening | ORAL | 0 refills | Status: DC | PRN

## 2021-12-22 MED ORDER — ALBUTEROL SULFATE (2.5 MG/3ML) 0.083% IN NEBU: 2.5000 mg | INHALATION_SOLUTION | Freq: Once | RESPIRATORY_TRACT | Status: DC

## 2021-12-22 MED ORDER — METHYLPREDNISOLONE ACETATE 80 MG/ML IJ SUSP
80.0000 mg | Freq: Once | INTRAMUSCULAR | Status: AC
  Administered 2021-12-22: 80 mg via INTRAMUSCULAR

## 2021-12-22 MED ORDER — PREDNISONE 10 MG PO TABS: ORAL_TABLET | ORAL | 0 refills | Status: DC

## 2021-12-22 MED ORDER — ALBUTEROL SULFATE (2.5 MG/3ML) 0.083% IN NEBU: 2.5000 mg | INHALATION_SOLUTION | Freq: Four times a day (QID) | RESPIRATORY_TRACT | 3 refills | Status: DC | PRN

## 2021-12-22 NOTE — Addendum Note (Signed)
Addended by: Geraldo Docker on: 12/22/2021 10:19 AM   Modules accepted: Orders

## 2021-12-22 NOTE — Assessment & Plan Note (Addendum)
This is her third exacerbation in the last 3 to 4 months requiring prednisone.  FeNO was elevated today to 64 ppb. Has felt like Symbicort doesn't help as much as albuterol. We discussed the importance of a maintenance inhaler, especially given her frequent flares. Advised her to restart Symbicort 2 puffs Twice daily and use with spacer. Continue to use albuterol PRN. Will check CBC with diff and IgE today to assess eos and for allergic phenotype. Depo 80 mg inj x1 today. Albuterol neb with improvement in lung sounds and cough. Prednisone taper. Close follow up.  Patient Instructions  -Continue Albuterol inhaler 2 puffs or 3 mL neb every 6 hours as needed for shortness of breath or wheezing. Notify if symptoms persist despite rescue inhaler/neb use. -Continue pepcid 20 mg At bedtime  -Continue Allegra 180 mg daily -Restart Symbicort 2 puffs Twice daily. Brush tongue and rinse mouth out well afterwards. Use with spacer -Continue Tessalon Perles 200 mg every 8 hours as needed for cough -Continue Mucinex DM 600 mg Twice daily for cough/chest congestion   -Prednisone taper. 4 tabs for 2 days, then 3 tabs for 2 days, 2 tabs for 2 days, then 1 tab for 2 days, then stop. Take in AM with food. Start tomorrow as we gave you a steroid shot today. -Flonase nasal spray 2 sprays each nostril daily -Tussionex cough syrup 5 mL At bedtime as needed for cough. May cause drowsiness and unsteadiness. Do not drive after taking.  Labs today - CBC with diff, IgE  Follow up in 2 weeks with Dr. Everardo All or Katie Kristiana Jacko,NP. If symptoms do not improve or worsen, please contact office for sooner follow up with chest x ray or seek emergency care.

## 2021-12-22 NOTE — Assessment & Plan Note (Signed)
Regular rhythm and rate at visit. No acute cardiac symptoms. Follow up with cardiology as scheduled.

## 2021-12-22 NOTE — Assessment & Plan Note (Signed)
Feels as though it is well-controlled on current regimen. Could consider step up to PPI if upper airway syndrome persists despite optimal asthma control.

## 2021-12-22 NOTE — Patient Instructions (Addendum)
-  Continue Albuterol inhaler 2 puffs or 3 mL neb every 6 hours as needed for shortness of breath or wheezing. Notify if symptoms persist despite rescue inhaler/neb use. -Continue pepcid 20 mg At bedtime  -Continue Allegra 180 mg daily -Restart Symbicort 2 puffs Twice daily. Brush tongue and rinse mouth out well afterwards. Use with spacer -Continue Tessalon Perles 200 mg every 8 hours as needed for cough -Continue Mucinex DM 600 mg Twice daily for cough/chest congestion   -Prednisone taper. 4 tabs for 2 days, then 3 tabs for 2 days, 2 tabs for 2 days, then 1 tab for 2 days, then stop. Take in AM with food. Start tomorrow as we gave you a steroid shot today. -Flonase nasal spray 2 sprays each nostril daily -Tussionex cough syrup 5 mL At bedtime as needed for cough. May cause drowsiness and unsteadiness. Do not drive after taking.  Labs today - CBC with diff, IgE  Follow up in 2 weeks with Dr. Everardo All or Katie Keigo Whalley,NP. If symptoms do not improve or worsen, please contact office for sooner follow up with chest x ray or seek emergency care.

## 2021-12-22 NOTE — Assessment & Plan Note (Signed)
Does appear/sound to have an upper airway component to her cough as well. GERD control with pepcid. Optimize cough control - advised to use tessalon perles consistently throughout day and Mucinex DM; tussionex rx At bedtime. PDMP reviewed.

## 2021-12-23 LAB — IGE: IgE (Immunoglobulin E), Serum: 8 kU/L (ref <=114)

## 2021-12-23 NOTE — Progress Notes (Signed)
Eosinophils elevated on CBC, which are a type of WBC that are usually elevated as a response to allergies. Awaiting IgE. She just restarted her Allegra a few days ago and we started her on Twice daily use of her Symbicort yesterday so wouldn't make any further changes at this time. May consider addition of singulair going forward if symptoms persist despite consistent use. Advise her to notify us if she doesn't improve or worsens before her next follow up.

## 2021-12-23 NOTE — Progress Notes (Signed)
IgE nl

## 2021-12-24 ENCOUNTER — Telehealth: Payer: Self-pay | Admitting: Pulmonary Disease

## 2021-12-24 NOTE — Telephone Encounter (Signed)
Noemi Chapel, NP  ?12/23/2021  4:28 PM EST Back to Top  ?  ?IgE nl  ? Micheline Maze V, NP  ?12/23/2021 12:10 PM EST   ?  ?Eosinophils elevated on CBC, which are a type of WBC that are usually elevated as a response to allergies. Awaiting IgE. She just restarted her Allegra a few days ago and we started her on Twice daily use of her Symbicort yesterday so wouldn't make any further changes at this time. May consider addition of singulair going forward if symptoms persist despite consistent use. Advise her to notify us if she doesn't improve or worsens before her next follow up.   ? ? ? ?Called and spoke with pt letting her know the results of bloodwork and she verbalized understanding. Nothing further needed. ?

## 2021-12-24 NOTE — Telephone Encounter (Signed)
Pt also called the office and was made aware of lab results.nothing further needed. ?

## 2022-01-05 ENCOUNTER — Ambulatory Visit: Payer: Medicare Other | Admitting: Pulmonary Disease

## 2022-01-06 ENCOUNTER — Other Ambulatory Visit: Payer: Self-pay

## 2022-01-06 ENCOUNTER — Encounter: Payer: Self-pay | Admitting: Pulmonary Disease

## 2022-01-06 ENCOUNTER — Ambulatory Visit (INDEPENDENT_AMBULATORY_CARE_PROVIDER_SITE_OTHER): Payer: Medicare Other | Admitting: Pulmonary Disease

## 2022-01-06 VITALS — BP 120/70 | HR 80 | Temp 98.1°F | Ht 68.0 in | Wt 176.0 lb

## 2022-01-06 DIAGNOSIS — J449 Chronic obstructive pulmonary disease, unspecified: Secondary | ICD-10-CM

## 2022-01-06 MED ORDER — MONTELUKAST SODIUM 10 MG PO TABS: 10.0000 mg | ORAL_TABLET | Freq: Every day | ORAL | 1 refills | Status: DC

## 2022-01-06 NOTE — Patient Instructions (Addendum)
COPD-asthma overlap ?--CONTINUE Symbicort 160-4.5 mcg TWO puffs TWICE a day ?--CONTINUE Albuterol nebulize AS NEEDED for shortness of breath ?--START singulair 10 mg daily ? ?Allergic Rhinitis ?--CONTINUE Flonase 2 sprays daily ? ?CANCEL May 4th appointment with me ?Follow-up with me in September 2023 ?

## 2022-01-06 NOTE — Progress Notes (Signed)
? ? ?Subjective:  ? ?PATIENT ID: [name] [name] GENDER: female DOB: [birth year], MRN: [id number] ? ? ?HPI ? ?Chief Complaint  ?Patient presents with  ? Follow-up  ?  Patient states that she is doing better since her last visit, no concerns  ? ?Reason for Visit: Follow-up ? ?[name] is a 76 year old female with paroxysmal atrial fibrillation, hypothyroidism, CKD stage III who presents for follow-up ? ?Initial Consult 12/08/20 ?She was previously evaluated at [facility] pulmonary by Dr. Delton Coombes and Dr. Delford Field.  Prior notes reviewed with her last visit in April 2013.  Per notes, she had PFTs were overall normal with possible airflow limitation.  She had previously trialed Spiriva however self discontinued due to hoarseness.  Dr. Delford Field had treated her with prednisone and antihistamines and PPI however she was lost to follow-up after moving.  Her recent note with her PCP on 09/05/2020 with Dr. Catha Gosselin states that she has persistent cough the last 5 to 6 weeks, occurring at least twice a year.  She is currently on a prednisone pack for back pain and this has not helped with her cough.  Dr. Clarene Duke has prescribed her Tessalon Perles, Zyrtec and Benadryl and omeprazole 20 mg daily.  Referred to pulmonary for further management. ? ?On this visit, she reports long standing cough for >10 years. Associated with wheezing especially with exertion including when walking upstairs or exercise. Her symptoms were similar when she was in [place] vs [place]. Her symptoms will worsen when having a cough and will be associated with chest congestion. She has tried diet restrictions and PPI without improvement. She usually plays golf once a week but has noticed she will get out of breath easily. She is trying to get more exercise by walking 1/2 miles in her building 1-2 times. She has tried an over the encounter inhaler (?Albuterol) without much relief but doesn't recall the name of it.  ? ?09/08/21 (Video) ?Since  our last visit, she was treated for COPD exacerbation in May (COVID-19) and September. She has been wheezing and coughing while helping her daughter recently. She is on Symbicort two days a week as she does not feel it helps. Does not have albuterol. Does not have spacer. ? ?09/23/21 ?Since our last visit she has completed steroids. Not on budesonide currently. She has wheezing when she is sick or weather related. At baseline she is moderately active, playing golf during good weather. No symptoms when she is doing well. ? ?01/06/22 ?Since her last visit she has been treated for COPD exacerbation in November, January and February requiring steroids including Depo shot last month.  Over the winter she was intermittently adherent to her inhalers and was advised last month to consistently take Symbicort due to her persistent symptoms. No longer wheezing or coughing.  ? ?Social History: ?Never smoker ?Significant secondhand smoke exposure ?Previously worked in [employer], assisted in moving in merchandise ?No wood burning stove in the house ?Son - SI ? ?Environmental exposures: None ? ? ?Past Medical History:  ?Diagnosis Date  ? Arthritis   ? Asthma   ? Atrial fibrillation (HCC)   ? C. difficile diarrhea   ? COPD (chronic obstructive pulmonary disease) (HCC)   ? High cholesterol   ? Hyperlipemia   ? IBS (irritable bowel syndrome)   ? Lymphocytic colitis   ? Renal disorder   ? Thyroid disease   ?  ? ?Family History  ?Problem Relation Age of Onset  ? Emphysema Mother   ?  Heart disease Father   ? Rheum arthritis Maternal Grandmother   ? Rheum arthritis Son   ? Lupus Son   ? Sjogren's syndrome Son   ?  ? ?Social History  ? ?Occupational History  ? Occupation: retired  ?Tobacco Use  ? Smoking status: Former  ?  Years: 1.00  ?  Types: Cigarettes  ?  Start date: 10/1961  ?  Quit date: 09/1962  ?  Years since quitting: 59.3  ? Smokeless tobacco: Never  ? Tobacco comments:  ?  passive cigarette smoke exposure, smoked  in high school  ?Vaping Use  ? Vaping Use: Never used  ?Substance and Sexual Activity  ? Alcohol use: Yes  ?  Comment: 3 times per week  ? Drug use: No  ? Sexual activity: Not on file  ? ? ?Allergies  ?Allergen Reactions  ? Shellfish Allergy Swelling  ? Prednisone Other (See Comments)  ?  Face turns red  ? Sulfonamide Derivatives Nausea Only  ?  ? ?Outpatient Medications Prior to Visit  ?Medication Sig Dispense Refill  ? acetaminophen (TYLENOL) 325 MG tablet Take 1-3 tablets by mouth as needed.    ? albuterol (PROVENTIL) (2.5 MG/3ML) 0.083% nebulizer solution Take 3 mLs (2.5 mg total) by nebulization every 6 (six) hours as needed for wheezing or shortness of breath. 75 mL 3  ? albuterol (VENTOLIN HFA) 108 (90 Base) MCG/ACT inhaler Inhale 2 puffs into the lungs every 6 (six) hours as needed for wheezing or shortness of breath. 8 g 6  ? Ascorbic Acid (VITAMIN C PO) Take 1 tablet by mouth daily.    ? aspirin EC 81 MG tablet Take 1 tablet (81 mg total) by mouth daily. Swallow whole. (Patient taking differently: Take 81 mg by mouth as needed. Swallow whole.) 90 tablet 3  ? B Complex-C (SUPER B COMPLEX PO) Take 1 tablet by mouth daily.    ? benzonatate (TESSALON) 200 MG capsule Take 1 capsule (200 mg total) by mouth 3 (three) times daily as needed for cough. 30 capsule 1  ? budesonide (ENTOCORT EC) 3 MG 24 hr capsule Take 1-2 capsules by mouth as needed.    ? budesonide-formoterol (SYMBICORT) 80-4.5 MCG/ACT inhaler Inhale 2 puffs into the lungs 2 (two) times daily. (Patient not taking: Reported on 12/22/2021) 1 each 5  ? chlorpheniramine-HYDROcodone (TUSSIONEX PENNKINETIC ER) 10-8 MG/5ML Take 5 mLs by mouth at bedtime as needed for cough. 70 mL 0  ? Cholecalciferol (VITAMIN D3) 10 MCG (400 UNIT) tablet Take 1 tablet by mouth daily.    ? famotidine (PEPCID) 20 MG tablet Take 20 mg by mouth at bedtime.    ? fexofenadine (ALLEGRA) 180 MG tablet Take 180 mg by mouth daily.    ? fluticasone (FLONASE) 50 MCG/ACT nasal spray  Place 2 sprays into both nostrils daily. 18.2 mL 2  ? levothyroxine (SYNTHROID) 88 MCG tablet Take 88 mcg by mouth daily.    ? meclizine (ANTIVERT) 50 MG tablet Take 1 tablet by mouth as needed.    ? Melatonin 5 MG CAPS Take 1 tablet by mouth at bedtime.    ? nitroGLYCERIN (NITROSTAT) 0.4 MG SL tablet Place 1 tablet (0.4 mg total) under the tongue every 5 (five) minutes as needed for chest pain. (Patient not taking: Reported on 12/02/2021) 90 tablet 3  ? predniSONE (DELTASONE) 10 MG tablet 4 tabs for 2 days, then 3 tabs for 2 days, 2 tabs for 2 days, then 1 tab for 2 days, then stop 20  tablet 0  ? rosuvastatin (CRESTOR) 5 MG tablet Take 1 tablet (5 mg total) by mouth daily. 90 tablet 3  ? sertraline (ZOLOFT) 50 MG tablet Take 50 mg by mouth daily.     ? ?Facility-Administered Medications Prior to Visit  ?Medication Dose Route Frequency Provider Last Rate Last Admin  ? albuterol (PROVENTIL) (2.5 MG/3ML) 0.083% nebulizer solution 2.5 mg  2.5 mg Nebulization Once Cobb, Ruby Cola, NP      ? ? ?ROS ?Per HPI ? ?Objective:  ? ?Vitals:  ? 01/06/22 1022  ?BP: 120/70  ?Pulse: 80  ?Temp: 98.1 ?F (36.7 ?C)  ?TempSrc: Oral  ?SpO2: 95%  ?Weight: 176 lb (79.8 kg)  ?Height: 5\' 8"  (1.727 m)  ? ?  ? ?Physical Exam: ?General: Well-appearing, no acute distress ?HENT: [place], AT ?Eyes: EOMI, no scleral icterus ?Respiratory: Clear to auscultation bilaterally.  No crackles, wheezing or rales ?Cardiovascular: RRR, -M/R/G, no JVD ?Extremities:-Edema,-tenderness ?Neuro: AAO x4, CNII-XII grossly intact ?Psych: Normal mood, normal affect ? ?Data Reviewed: ? ?Imaging: ?CXR 10/22/11 - Mild hyperinflation. No infiltrate, effusion or edema ?CT Coronary lung fields 08/20/20 - no evidence of interstitial/parenchymal changes, no infiltrate or ground glass opacities ?CXR 12/01/21 - Hyperinflation ? ?PFT: ?09/21/10 ?FVC 3.25 (93%) FEV1 2.3 (90%) Ratio 68  TLC 92% DLCO 101% ?Interpretation: Mild obstructive defect is present. FEV1/FVC ratio <70%.  No  significant bronchodilator response ? ?Labs: ?CBC ?   ?Component Value Date/Time  ? WBC 6.6 12/22/2021 0953  ? RBC 4.27 12/22/2021 0953  ? HGB 13.5 12/22/2021 0953  ? HCT 40.4 12/22/2021 0953  ? PLT 224.0 02/28/2

## 2022-01-12 ENCOUNTER — Encounter: Payer: Self-pay | Admitting: Pulmonary Disease

## 2022-01-21 ENCOUNTER — Other Ambulatory Visit: Payer: Self-pay | Admitting: Cardiology

## 2022-02-23 ENCOUNTER — Telehealth: Payer: Self-pay | Admitting: Cardiology

## 2022-02-23 NOTE — Telephone Encounter (Signed)
Patient would like to switch from Dr. Dulce Sellar in [facility] to Dr. Antoine Poche at [place]. She lives closer to the [facility] office and would like to not have to drive too far if possible. ? ?Please let the patient know what the office decides  ?

## 2022-02-23 NOTE — Telephone Encounter (Signed)
Will forward to dr Dulce Sellar and dr hochrein for okay to switch. ?

## 2022-02-24 NOTE — Telephone Encounter (Signed)
Spoke with pt, Follow up scheduled  

## 2022-02-25 ENCOUNTER — Ambulatory Visit: Payer: Medicare Other | Admitting: Pulmonary Disease

## 2022-03-01 DIAGNOSIS — L03115 Cellulitis of right lower limb: Secondary | ICD-10-CM | POA: Diagnosis not present

## 2022-03-03 DIAGNOSIS — E039 Hypothyroidism, unspecified: Secondary | ICD-10-CM | POA: Diagnosis not present

## 2022-03-04 NOTE — Progress Notes (Signed)
?  ?Cardiology Office Note ? ? ?Date:  03/05/2022  ? ?ID:  [name] [name], DOB 10/16/1946, MRN [id number] ? ?PCP:  [name] [name], [name] Alan, MD  ?Cardiologist:   None ?Referring:  Self ? ?Chief Complaint  ?Patient presents with  ? Shortness of Breath  ? ? ?  ?History of Present Illness: ?[name] NeasMargaret H Marin is a 76 y.o. female who presents for follow up of CAD  She underwent cardiac CTA reported 08/20/2020.  Her calcium score was 74 53rd percentile for age and sex CAD was present with LAD stenosis 50 to 69% left circumflex 25 to 49% and FFR was normal in both vessels.  ? ?She has not been seen since 2021.  She has severe COPD and asthma.  She has had increasing shortness of breath.  I do see multiple treatments with prednisone and she is followed closely by pulmonary.  Unfortunately she is having increasing shortness of breath with any activities such as walking 25 yards on level ground.  She is not describing PND or orthopnea.  She does get some discomfort in her left arm if she walks moderate distance.  She thinks this has been a relatively stable pattern.  She had to use nitroglycerin probably once in a year.  She does notice her heart rate though going higher than it did previously.  It will go in the 100 bpm with minimal activity.  She is not describing presyncope or syncope.  She does not describe substernal chest discomfort.  She has wheezing but no cough fevers or chills. ? ? ?Past Medical History:  ?Diagnosis Date  ? Arthritis   ? Asthma   ? Atrial fibrillation (HCC)   ? C. difficile diarrhea   ? COPD (chronic obstructive pulmonary disease) (HCC)   ? High cholesterol   ? Hyperlipemia   ? IBS (irritable bowel syndrome)   ? Lymphocytic colitis   ? Renal disorder   ? Thyroid disease   ? ? ?Past Surgical History:  ?Procedure Laterality Date  ? ROTATOR CUFF REPAIR Right 2014  ? TONSILLECTOMY    ? ? ? ?Current Outpatient Medications  ?Medication Sig Dispense Refill  ? acetaminophen (TYLENOL) 325 MG tablet Take 1-3  tablets by mouth as needed.    ? albuterol (PROVENTIL) (2.5 MG/3ML) 0.083% nebulizer solution Take 3 mLs (2.5 mg total) by nebulization every 6 (six) hours as needed for wheezing or shortness of breath. 75 mL 3  ? Ascorbic Acid (VITAMIN C PO) Take 1 tablet by mouth daily.    ? aspirin (ASPIRIN LOW DOSE) 81 MG EC tablet Take 1 tablet (81 mg total) by mouth 5 (five) times daily. Patient needs appointment for further refills. 1 st attempt 30 tablet 0  ? B Complex-C (SUPER B COMPLEX PO) Take 1 tablet by mouth daily.    ? benzonatate (TESSALON) 200 MG capsule Take 1 capsule (200 mg total) by mouth 3 (three) times daily as needed for cough. 30 capsule 1  ? budesonide (ENTOCORT EC) 3 MG 24 hr capsule Take 1-2 capsules by mouth as needed.    ? budesonide-formoterol (SYMBICORT) 80-4.5 MCG/ACT inhaler Inhale 2 puffs into the lungs 2 (two) times daily. 1 each 5  ? chlorpheniramine-HYDROcodone (TUSSIONEX PENNKINETIC ER) 10-8 MG/5ML Take 5 mLs by mouth at bedtime as needed for cough. 70 mL 0  ? Cholecalciferol (VITAMIN D3) 10 MCG (400 UNIT) tablet Take 1 tablet by mouth daily.    ? famotidine (PEPCID) 20 MG tablet Take 20 mg by mouth at  bedtime.    ? fluticasone (FLONASE) 50 MCG/ACT nasal spray Place 2 sprays into both nostrils daily. 18.2 mL 2  ? levothyroxine (SYNTHROID) 88 MCG tablet Take 88 mcg by mouth daily.    ? meclizine (ANTIVERT) 50 MG tablet Take 1 tablet by mouth as needed.    ? Melatonin 5 MG CAPS Take 1 tablet by mouth at bedtime.    ? montelukast (SINGULAIR) 10 MG tablet Take 1 tablet (10 mg total) by mouth at bedtime. 90 tablet 1  ? predniSONE (DELTASONE) 10 MG tablet 4 tabs for 2 days, then 3 tabs for 2 days, 2 tabs for 2 days, then 1 tab for 2 days, then stop 20 tablet 0  ? sertraline (ZOLOFT) 50 MG tablet Take 50 mg by mouth daily.     ? nitroGLYCERIN (NITROSTAT) 0.4 MG SL tablet Place 1 tablet (0.4 mg total) under the tongue every 5 (five) minutes as needed for chest pain. (Patient not taking: Reported on  12/02/2021) 90 tablet 3  ? rosuvastatin (CRESTOR) 5 MG tablet Take 1 tablet (5 mg total) by mouth daily. 90 tablet 3  ? ?Current Facility-Administered Medications  ?Medication Dose Route Frequency Provider Last Rate Last Admin  ? albuterol (PROVENTIL) (2.5 MG/3ML) 0.083% nebulizer solution 2.5 mg  2.5 mg Nebulization Once Cobb, Ruby Cola, NP      ? ? ?Allergies:   Shellfish allergy, Prednisone, and Sulfonamide derivatives  ? ? ?ROS:  Please see the history of present illness.   Otherwise, review of systems are positive for none.   All other systems are reviewed and negative.  ? ? ?PHYSICAL EXAM: ?VS:  BP 108/66 (BP Location: Left Arm, Patient Position: Sitting, Cuff Size: Normal)   Pulse 82   Ht 5\' 8"  (1.727 m)   Wt 184 lb (83.5 kg)   BMI 27.98 kg/m?  , BMI Body mass index is 27.98 kg/m?. ?GENERAL:  Well appearing ?NECK:  No jugular venous distention, waveform within normal limits, carotid upstroke brisk and symmetric, no bruits, no thyromegaly ?LUNGS:  Clear to auscultation bilaterally ?CHEST: Decreased breath sounds with expiratory wheezing ?HEART:  PMI not displaced or sustained,S1 and S2 within normal limits, no S3, no S4, no clicks, no rubs, no murmurs ?ABD:  Flat, positive bowel sounds normal in frequency in pitch, no bruits, no rebound, no guarding, no midline pulsatile mass, no hepatomegaly, no splenomegaly ?EXT:  2 plus pulses throughout, no edema, no cyanosis no clubbing ? ?EKG:  EKG is ordered today. ?The ekg ordered today demonstrates sinus rhythm, rate 82, axis within normal limits, intervals within normal limits, no acute ST-T wave changes. ? ? ?Recent Labs: ?12/22/2021: Hemoglobin 13.5; Platelets 224.0  ? ? ?Lipid Panel ?No results found for: CHOL, TRIG, HDL, CHOLHDL, VLDL, LDLCALC, LDLDIRECT ?  ? ?Wt Readings from Last 3 Encounters:  ?03/05/22 184 lb (83.5 kg)  ?01/06/22 176 lb (79.8 kg)  ?12/22/21 181 lb 3.2 oz (82.2 kg)  ?  ? ? ?Other studies Reviewed: ?Additional studies/ records that were  reviewed today include: CT 2021. ?Review of the above records demonstrates:  Please see elsewhere in the note.   ? ? ?ASSESSMENT AND PLAN: ? ?CAD: Given the nonobstructive disease she had before and progression in symptoms namely shortness of breath I do need to make sure that she has not had progression in disease.  She cannot have perfusion imaging because of her wheezing.  Therefore, she will have a cardiac CT likely with FFR I will avoid a beta-blocker because  of her wheezing.  We will rate control with Corlanor for the test. ? ?DYSLIPIDEMIA: I would like her Crestor to be increased to 5 mg every day.  She is only been taking it about 3 times a week.  I would like her to get a lipid profile fasting in about 3 months.  Her LDL was 121 and the goal should be less than 70. ? ?Current medicines are reviewed at length with the patient today.  The patient does not have concerns regarding medicines. ? ?The following changes have been made:  no change ? ?Labs/ tests ordered today include:  ? ?Orders Placed This Encounter  ?Procedures  ? CT CORONARY MORPH W/CTA COR W/SCORE W/CA W/CM &/OR WO/CM  ? Lipid panel  ? ? ? ?Disposition:   FU with me in year   ? ? ?Signed, ?Rollene Rotunda, MD  ?03/05/2022 2:42 PM    ? [facility] ? ? ? ?

## 2022-03-05 ENCOUNTER — Encounter: Payer: Self-pay | Admitting: Cardiology

## 2022-03-05 ENCOUNTER — Ambulatory Visit (INDEPENDENT_AMBULATORY_CARE_PROVIDER_SITE_OTHER): Payer: Medicare Other | Admitting: Cardiology

## 2022-03-05 VITALS — BP 108/66 | HR 82 | Ht 68.0 in | Wt 184.0 lb

## 2022-03-05 DIAGNOSIS — I25118 Atherosclerotic heart disease of native coronary artery with other forms of angina pectoris: Secondary | ICD-10-CM

## 2022-03-05 DIAGNOSIS — R0609 Other forms of dyspnea: Secondary | ICD-10-CM | POA: Diagnosis not present

## 2022-03-05 DIAGNOSIS — M79602 Pain in left arm: Secondary | ICD-10-CM

## 2022-03-05 DIAGNOSIS — E785 Hyperlipidemia, unspecified: Secondary | ICD-10-CM

## 2022-03-05 MED ORDER — ROSUVASTATIN CALCIUM 5 MG PO TABS: 5.0000 mg | ORAL_TABLET | Freq: Every day | ORAL | 3 refills | Status: DC

## 2022-03-05 NOTE — Patient Instructions (Signed)
Medications:  ? ?INCREASE ROSUVASTATIN TO 5 MG EVERYDAY ? ?Lab: ? ?Your physician recommends that you return for lab work in: 3 MONTHS FASTING ? ?Testing/Procedures: ? ?Your cardiac CT will be scheduled at  ? ?[facility] ?[address] ?[place], [place] [address] ?[phone] ? ? ?If scheduled at [facility], please arrive at the [facility] and Children's Entrance (Entrance C2) of [facility] 30 minutes prior to test start time. ?You can use the FREE valet parking offered at entrance C (encouraged to control the heart rate for the test)  ?Proceed to the [facility] Radiology Department (first floor) to check-in and test prep. ? ?All radiology patients and guests should use entrance C2 at [facility], accessed from [facility], even though the hospital's physical address listed is [address]. ? ? ? ? ?Please follow these instructions carefully (unless otherwise directed): ? ? ?On the Night Before the Test: ?Be sure to Drink plenty of water. ?Do not consume any caffeinated/decaffeinated beverages or chocolate 12 hours prior to your test. ?Do not take any antihistamines 12 hours prior to your test. ? ?On the Day of the Test: ?Drink plenty of water until 1 hour prior to the test. ?Do not eat any food 4 hours prior to the test. ?You may take your regular medications prior to the test.  ?Take CORLANOR 10 MG two hours prior to test. ?HOLD Furosemide/Hydrochlorothiazide morning of the test. ?FEMALES- please wear underwire-free bra if available, avoid dresses & tight clothing ? ?  ?After the Test: ?Drink plenty of water. ?After receiving IV contrast, you may experience a mild flushed feeling. This is normal. ?On occasion, you may experience a mild rash up to 24 hours after the test. This is not dangerous. If this occurs, you can take Benadryl 25 mg and increase your fluid intake. ?If you experience trouble breathing, this can be serious. If it is severe call 911  IMMEDIATELY. If it is mild, please call our office. ?If you take any of these medications: Glipizide/Metformin, Avandament, Glucavance, please do not take 48 hours after completing test unless otherwise instructed. ? ?We will call to schedule your test 2-4 weeks out understanding that some insurance companies will need an authorization prior to the service being performed.  ? ?For non-scheduling related questions, please contact the cardiac imaging nurse navigator should you have any questions/concerns: ?Rockwell Alexandria, Cardiac Imaging Nurse Navigator ?Larey Brick, Cardiac Imaging Nurse Navigator ? [facility] ?Direct Office Dial: [phone]  ? ?For scheduling needs, including cancellations and rescheduling, please call [place], [phone].  ? ? ?Follow-Up: ?At [facility], you and your health needs are our priority.  As part of our continuing mission to provide you with exceptional heart care, we have created designated Provider Care Teams.  These Care Teams include your primary Cardiologist (physician) and Advanced Practice Providers (APPs -  Physician Assistants and Nurse Practitioners) who all work together to provide you with the care you need, when you need it. ? ?We recommend signing up for the patient portal called "MyChart".  Sign up information is provided on this After Visit Summary.  MyChart is used to connect with patients for Virtual Visits (Telemedicine).  Patients are able to view lab/test results, encounter notes, upcoming appointments, etc.  Non-urgent messages can be sent to your provider as well.   ?To learn more about what you can do with MyChart, go to [email or web address].   ? ?Your next appointment:   ?  12 month(s) ? ?The format for your next appointment:   ?In Person ? ?Provider:   ?Rollene Rotunda MD  ? ? ? ? ?Important Information About Sugar ? ? ? ? ?  ?

## 2022-03-08 NOTE — Addendum Note (Signed)
Addended by: Venetia Maxon on: 03/08/2022 09:54 AM ? ? Modules accepted: Orders ? ?

## 2022-03-15 DIAGNOSIS — E785 Hyperlipidemia, unspecified: Secondary | ICD-10-CM | POA: Diagnosis not present

## 2022-03-15 LAB — LIPID PANEL
Chol/HDL Ratio: 2.7 ratio (ref 0.0–4.4)
Cholesterol, Total: 148 mg/dL (ref 100–199)
HDL: 55 mg/dL (ref >39)
LDL Chol Calc (NIH): 71 mg/dL (ref 0–99)
Triglycerides: 122 mg/dL (ref 0–149)
VLDL Cholesterol Cal: 22 mg/dL (ref 5–40)

## 2022-03-18 ENCOUNTER — Telehealth (HOSPITAL_COMMUNITY): Payer: Self-pay | Admitting: *Deleted

## 2022-03-18 NOTE — Telephone Encounter (Signed)
Reaching out to patient to offer assistance regarding upcoming cardiac imaging study; pt verbalizes understanding of appt date/time, parking situation and where to check in, pre-test NPO status and medications ordered, and verified current allergies; name and call back number provided for further questions should they arise  Larey Brick RN Navigator Cardiac Imaging [facility] [phone] office [phone] cell  Patient to take 10mg  ivabradine two hours prior to her cardiac CT scan.  She is aware to arrive at 1:30pm.

## 2022-03-19 ENCOUNTER — Ambulatory Visit (HOSPITAL_COMMUNITY)
Admission: RE | Admit: 2022-03-19 | Discharge: 2022-03-19 | Disposition: A | Discharge status: Home / Self Care | Payer: Medicare Other | Source: Ambulatory Visit | Attending: Cardiology | Admitting: Cardiology

## 2022-03-19 ENCOUNTER — Other Ambulatory Visit: Payer: Self-pay | Admitting: Nurse Practitioner

## 2022-03-19 ENCOUNTER — Encounter (HOSPITAL_COMMUNITY): Payer: Self-pay

## 2022-03-19 DIAGNOSIS — M79602 Pain in left arm: Secondary | ICD-10-CM | POA: Insufficient documentation

## 2022-03-19 DIAGNOSIS — R0982 Postnasal drip: Secondary | ICD-10-CM | POA: Diagnosis not present

## 2022-03-19 DIAGNOSIS — I25118 Atherosclerotic heart disease of native coronary artery with other forms of angina pectoris: Secondary | ICD-10-CM | POA: Insufficient documentation

## 2022-03-19 DIAGNOSIS — R0609 Other forms of dyspnea: Secondary | ICD-10-CM | POA: Insufficient documentation

## 2022-03-19 DIAGNOSIS — R058 Other specified cough: Secondary | ICD-10-CM

## 2022-03-19 DIAGNOSIS — H9193 Unspecified hearing loss, bilateral: Secondary | ICD-10-CM | POA: Diagnosis not present

## 2022-03-19 DIAGNOSIS — J309 Allergic rhinitis, unspecified: Secondary | ICD-10-CM | POA: Diagnosis not present

## 2022-03-19 DIAGNOSIS — H65193 Other acute nonsuppurative otitis media, bilateral: Secondary | ICD-10-CM | POA: Diagnosis not present

## 2022-03-19 MED ORDER — NITROGLYCERIN 0.4 MG SL SUBL
SUBLINGUAL_TABLET | SUBLINGUAL | Status: AC
  Administered 2022-03-19: 0.8 mg
  Filled 2022-03-19: qty 2

## 2022-03-19 MED ORDER — METOPROLOL TARTRATE 5 MG/5ML IV SOLN
INTRAVENOUS | Status: AC
  Filled 2022-03-19: qty 5

## 2022-03-19 MED ORDER — IOHEXOL 350 MG/ML SOLN
100.0000 mL | Freq: Once | INTRAVENOUS | Status: AC | PRN
  Administered 2022-03-19: 100 mL via INTRAVENOUS

## 2022-03-23 ENCOUNTER — Telehealth: Payer: Self-pay | Admitting: Cardiology

## 2022-03-23 NOTE — Telephone Encounter (Signed)
Patient would like Dr. Rosezella Florida RN to go over the results of her CT with her over the phone

## 2022-03-23 NOTE — Telephone Encounter (Signed)
Patient given the following results from [facility] "There is moderate but not obstructive CAD.  She has an incidental PFO that does not require further treatment or imaging." Patient asked for calcium score, which was given to her. She is taking Crestor 5 mg daily and will get fasting lipid in 3 months as previously ordered.

## 2022-04-11 DIAGNOSIS — S8991XA Unspecified injury of right lower leg, initial encounter: Secondary | ICD-10-CM | POA: Diagnosis not present

## 2022-04-11 DIAGNOSIS — S80211A Abrasion, right knee, initial encounter: Secondary | ICD-10-CM | POA: Diagnosis not present

## 2022-04-19 ENCOUNTER — Telehealth: Payer: Self-pay | Admitting: Pulmonary Disease

## 2022-04-19 MED ORDER — PREDNISONE 10 MG PO TABS: 40.0000 mg | ORAL_TABLET | Freq: Every day | ORAL | 0 refills | Status: AC

## 2022-04-19 NOTE — Telephone Encounter (Signed)
COPD exacerbation --ORDER prednisone 40 mg x 5 days.  If symptoms worsen, she will need appt with NP

## 2022-04-19 NOTE — Telephone Encounter (Signed)
I called and she reports that she is having some cough that is mostly dry and having some green sputum at times. She reports that she has noticed it getting worse in the last few weeks. She has taken mucinex and some wheezing. She is using her nebulizer and her Symbicort with no relief. She has an appointment with allergy the end of next month but wants some relief before then. Please advise.

## 2022-04-30 ENCOUNTER — Telehealth: Payer: Self-pay | Admitting: Pulmonary Disease

## 2022-04-30 NOTE — Telephone Encounter (Signed)
Patient called from this number requesting to speak with Dr. Everardo All or her PA personally, once I told her Dr. Everardo All was not in and the PA was finishing up with patients, she said never mind and hung up after telling her I would be happy to take a message and send it back. Can we please check in with her?

## 2022-05-03 NOTE — Telephone Encounter (Signed)
Called patient but she did not answer. Left message for her to call us back.  

## 2022-05-19 DIAGNOSIS — J3089 Other allergic rhinitis: Secondary | ICD-10-CM | POA: Diagnosis not present

## 2022-05-19 DIAGNOSIS — H1045 Other chronic allergic conjunctivitis: Secondary | ICD-10-CM | POA: Diagnosis not present

## 2022-05-19 DIAGNOSIS — J4540 Moderate persistent asthma, uncomplicated: Secondary | ICD-10-CM | POA: Diagnosis not present

## 2022-05-19 DIAGNOSIS — T781XXD Other adverse food reactions, not elsewhere classified, subsequent encounter: Secondary | ICD-10-CM | POA: Diagnosis not present

## 2022-05-26 DIAGNOSIS — T781XXA Other adverse food reactions, not elsewhere classified, initial encounter: Secondary | ICD-10-CM | POA: Diagnosis not present

## 2022-05-31 DIAGNOSIS — M19011 Primary osteoarthritis, right shoulder: Secondary | ICD-10-CM | POA: Diagnosis not present

## 2022-06-02 ENCOUNTER — Encounter: Payer: Self-pay | Admitting: Pulmonary Disease

## 2022-06-03 ENCOUNTER — Telehealth: Payer: Self-pay

## 2022-06-03 NOTE — Telephone Encounter (Signed)
   Pre-operative Risk Assessment    Patient Name: [name] [name]  DOB: August 14, 1946 MRN: [id number]      Request for Surgical Clearance    Procedure:   Right reverse shoulder arthroplasty   Date of Surgery:  Clearance TBD                                 Surgeon:  Dr. Francena Hanly  Surgeon's Group or Practice Name:  [facility] Phone number:  [phone] Fax number:  [phone]   Type of Clearance Requested:   - Medical  - Pharmacy:  Hold Aspirin     Type of Anesthesia:  General    Additional requests/questions:    [name]   06/03/2022, 1:03 PM

## 2022-06-03 NOTE — Telephone Encounter (Signed)
   Name: [name] [name]  DOB: 1946/01/27  MRN: [id number]  Primary Cardiologist: Dr. [place] Lions  Chart reviewed as part of pre-operative protocol coverage. Because of Graclynn Vanantwerp Bame's past medical history and time since last visit, she will require a follow-up in-office visit in order to better assess preoperative cardiovascular risk.  At last visit 02/2022, was complaining of increasing SOB as well as left arm discomfort. Coronary CTA showed elevated calcium score but mild CAD. No prior echo on file. Given poor functional baseline with increasing symptoms at last visit unlikely to be able to clear in virtual visit, recommend in-office OV since surgery will be under general anesthesia.  Pre-op covering staff: - Please schedule appointment and call patient to inform them. If patient already had an upcoming appointment within acceptable timeframe, please add "pre-op clearance" to the appointment notes so provider is aware. - Please contact requesting surgeon's office via preferred method (i.e, phone, fax) to inform them of need for appointment prior to surgery.  Re: ASA. No prior MI/PCI so may be appropriate to clear to hold if no additional procedures planned.  Laurann Montana, PA-C  06/03/2022, 1:29 PM

## 2022-06-03 NOTE — Telephone Encounter (Signed)
Mychart message sent by pt: [name] [name] (supporting Chi Mechele Collin, MD) 15 hours ago (6:22 PM)   [facility] Dr. Everardo All, I have been told I need to have reverse shoulder surgery.  Is there anything with my pulmonary history that would make this surgery inadvisable?  Please respond to me ASAP as they are wanting to schedule this surgery  Thanks so much, [name] [name]   [phone]    Dr. Everardo All, please advise.

## 2022-06-03 NOTE — Telephone Encounter (Signed)
Called patient. She has had >3 COPD exacerbations this year including last episode in June. She intermittently takes her Symbicort 80-4.5 mcg. We discussed inhaler compliance.   Please schedule patient with me on 8/21 at 4:15 for pre-op pulm evaluation. Pending her symptoms, will consider increasing Symbicort to 180. Patient not willing to try chronic macrolide due to concern for diarrhea related to antibiotic use.

## 2022-06-04 NOTE — Telephone Encounter (Signed)
I s/w the pt that she needed an appt in office for pre op clearance. Pt tells me that she also d/w her pulmonologist Dr. Everardo All and states that he told her that she was high risk and does not recommend she has the surgery. I did offer the pt that if she still wants to come in and d/w cardiology I will be happy to make appt. Pt stated she is not going to have the surgery at this time. I will update the requesting office as well

## 2022-06-14 ENCOUNTER — Ambulatory Visit (INDEPENDENT_AMBULATORY_CARE_PROVIDER_SITE_OTHER): Payer: Medicare Other | Admitting: Pulmonary Disease

## 2022-06-14 ENCOUNTER — Encounter: Payer: Self-pay | Admitting: Pulmonary Disease

## 2022-06-14 VITALS — BP 128/70 | HR 79 | Ht 68.0 in | Wt 182.6 lb

## 2022-06-14 DIAGNOSIS — J449 Chronic obstructive pulmonary disease, unspecified: Secondary | ICD-10-CM | POA: Diagnosis not present

## 2022-06-14 DIAGNOSIS — Z01811 Encounter for preprocedural respiratory examination: Secondary | ICD-10-CM

## 2022-06-14 MED ORDER — BUDESONIDE-FORMOTEROL FUMARATE 80-4.5 MCG/ACT IN AERO: 2.0000 | INHALATION_SPRAY | Freq: Two times a day (BID) | RESPIRATORY_TRACT | 5 refills | Status: DC

## 2022-06-14 MED ORDER — MECLIZINE HCL 50 MG PO TABS: 50.0000 mg | ORAL_TABLET | ORAL | 0 refills | Status: AC | PRN

## 2022-06-14 NOTE — Progress Notes (Signed)
Subjective:   PATIENT ID: [name] [name] GENDER: female DOB: [birth year], MRN: [id number]   HPI  Chief Complaint  Patient presents with   Follow-up    Copd Surgical clearance for rt shoulder   Reason for Visit: Follow-up  [name] is a 76 year old female with paroxysmal atrial fibrillation, hypothyroidism, CKD stage III who presents for follow-up  Initial Consult 12/08/20 She was previously evaluated at [facility] pulmonary by Dr. Delton Coombes and Dr. Delford Field.  Prior notes reviewed with her last visit in April 2013.  Per notes, she had PFTs were overall normal with possible airflow limitation.  She had previously trialed Spiriva however self discontinued due to hoarseness.  Dr. Delford Field had treated her with prednisone and antihistamines and PPI however she was lost to follow-up after moving.  Her recent note with her PCP on 09/05/2020 with Dr. Catha Gosselin states that she has persistent cough the last 5 to 6 weeks, occurring at least twice a year.  She is currently on a prednisone pack for back pain and this has not helped with her cough.  Dr. Clarene Duke has prescribed her Tessalon Perles, Zyrtec and Benadryl and omeprazole 20 mg daily.  Referred to pulmonary for further management.  On this visit, she reports long standing cough for >10 years. Associated with wheezing especially with exertion including when walking upstairs or exercise. Her symptoms were similar when she was in [place] vs [place]. Her symptoms will worsen when having a cough and will be associated with chest congestion. She has tried diet restrictions and PPI without improvement. She usually plays golf once a week but has noticed she will get out of breath easily. She is trying to get more exercise by walking 1/2 miles in her building 1-2 times. She has tried an over the encounter inhaler (?Albuterol) without much relief but doesn't recall the name of it.   09/08/21 (Video) Since our last visit, she was treated  for COPD exacerbation in May (COVID-19) and September. She has been wheezing and coughing while helping her daughter recently. She is on Symbicort two days a week as she does not feel it helps. Does not have albuterol. Does not have spacer.  09/23/21 Since our last visit she has completed steroids. Not on budesonide currently. She has wheezing when she is sick or weather related. At baseline she is moderately active, playing golf during good weather. No symptoms when she is doing well.  01/06/22 Since her last visit she has been treated for COPD exacerbation in November, January and February requiring steroids including Depo shot last month.  Over the winter she was intermittently adherent to her inhalers and was advised last month to consistently take Symbicort due to her persistent symptoms. No longer wheezing or coughing.   06/14/22 She presents today for pre-op surgical clearance for right reverse shoulder arthroplasty. In the last year she has had >3 COPD exacerbations requiring steroids including prednisone in June 2023. She is taking Symbicort 80 mcg and reports she was not consistent with inhaler use. She is now compliant with it, using the spacer. Not sure if it is helping but has not needed to use nebulizer in awhile. Occasional clearing with cough.  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 burning stove in the house Son - [name] [name]  Past Medical History:  Diagnosis Date   Arthritis    Asthma    Atrial fibrillation (HCC)  C. difficile diarrhea    COPD (chronic obstructive pulmonary disease) (HCC)    High cholesterol    Hyperlipemia    IBS (irritable bowel syndrome)    Lymphocytic colitis    Renal disorder    Thyroid disease      Family History  Problem Relation Age of Onset   Emphysema Mother    Heart disease Father    Rheum arthritis Maternal Grandmother    Rheum  arthritis Son    Lupus Son    Sjogren's syndrome Son      Social History   Occupational History   Occupation: retired  Tobacco Use   Smoking status: Former    Years: 1.00    Types: Cigarettes    Start date: 10/1961    Quit date: 09/1962    Years since quitting: 59.7   Smokeless tobacco: Never   Tobacco comments:    passive cigarette smoke exposure, smoked in high school  Vaping Use   Vaping Use: Never used  Substance and Sexual Activity   Alcohol use: Yes    Comment: 3 times per week   Drug use: No   Sexual activity: Not on file    Allergies  Allergen Reactions   Shellfish Allergy Swelling   Prednisone Other (See Comments)    Face turns red   Sulfonamide Derivatives Nausea Only     Outpatient Medications Prior to Visit  Medication Sig Dispense Refill   albuterol (PROVENTIL) (2.5 MG/3ML) 0.083% nebulizer solution Take 3 mLs (2.5 mg total) by nebulization every 6 (six) hours as needed for wheezing or shortness of breath. 75 mL 3   Ascorbic Acid (VITAMIN C PO) Take 1 tablet by mouth daily.     aspirin (ASPIRIN LOW DOSE) 81 MG EC tablet Take 1 tablet (81 mg total) by mouth 5 (five) times daily. Patient needs appointment for further refills. 1 st attempt 30 tablet 0   B Complex-C (SUPER B COMPLEX PO) Take 1 tablet by mouth daily.     budesonide (ENTOCORT EC) 3 MG 24 hr capsule Take 1-2 capsules by mouth as needed.     budesonide-formoterol (SYMBICORT) 80-4.5 MCG/ACT inhaler Inhale 2 puffs into the lungs 2 (two) times daily. 1 each 5   Cholecalciferol (VITAMIN D3) 10 MCG (400 UNIT) tablet Take 1 tablet by mouth daily.     famotidine (PEPCID) 20 MG tablet Take 20 mg by mouth at bedtime.     fluticasone (FLONASE) 50 MCG/ACT nasal spray SPRAY 2 SPRAYS INTO EACH NOSTRIL EVERY DAY 48 mL 2   levothyroxine (SYNTHROID) 88 MCG tablet Take 88 mcg by mouth daily.     meclizine (ANTIVERT) 50 MG tablet Take 1 tablet by mouth as needed.     Melatonin 5 MG CAPS Take 1 tablet by mouth at  bedtime.     montelukast (SINGULAIR) 10 MG tablet Take 1 tablet (10 mg total) by mouth at bedtime. 90 tablet 1   rosuvastatin (CRESTOR) 5 MG tablet Take 1 tablet (5 mg total) by mouth daily. 90 tablet 3   sertraline (ZOLOFT) 50 MG tablet Take 50 mg by mouth daily.      acetaminophen (TYLENOL) 325 MG tablet Take 1-3 tablets by mouth as needed. (Patient not taking: Reported on 06/14/2022)     benzonatate (TESSALON) 200 MG capsule Take 1 capsule (200 mg total) by mouth 3 (three) times daily as needed for cough. (Patient not taking: Reported on 06/14/2022) 30 capsule 1   chlorpheniramine-HYDROcodone (TUSSIONEX PENNKINETIC ER) 10-8 MG/5ML Take  5 mLs by mouth at bedtime as needed for cough. 70 mL 0   nitroGLYCERIN (NITROSTAT) 0.4 MG SL tablet Place 1 tablet (0.4 mg total) under the tongue every 5 (five) minutes as needed for chest pain. (Patient not taking: Reported on 12/02/2021) 90 tablet 3   Facility-Administered Medications Prior to Visit  Medication Dose Route Frequency Provider Last Rate Last Admin   albuterol (PROVENTIL) (2.5 MG/3ML) 0.083% nebulizer solution 2.5 mg  2.5 mg Nebulization Once Cobb, Ruby Cola, NP        Review of Systems  Constitutional:  Negative for chills, diaphoresis, fever, malaise/fatigue and weight loss.  HENT:  Negative for congestion.   Respiratory:  Positive for cough. Negative for hemoptysis, sputum production, shortness of breath and wheezing.   Cardiovascular:  Negative for chest pain, palpitations and leg swelling.   Per HPI  Objective:   Vitals:   06/14/22 1621  BP: 128/70  Pulse: 79  SpO2: 96%  Weight: 182 lb 9.6 oz (82.8 kg)  Height: 5\' 8"  (1.727 m)    SpO2: 96 % O2 Device: None (Room air)  Physical Exam: General: Well-appearing, no acute distress HENT: [place], AT Eyes: EOMI, no scleral icterus Respiratory: Clear to auscultation bilaterally.  No crackles, wheezing or rales Cardiovascular: RRR, -M/R/G, no JVD Extremities:-Edema,-tenderness Neuro:  AAO x4, CNII-XII grossly intact Psych: Normal mood, normal affect  Data Reviewed:  Imaging: CXR 10/22/11 - Mild hyperinflation. No infiltrate, effusion or edema CT Coronary lung fields 08/20/20 - no evidence of interstitial/parenchymal changes, no infiltrate or ground glass opacities CXR 12/01/21 - Hyperinflation  PFT: 09/21/10 FVC 3.25 (93%) FEV1 2.3 (90%) Ratio 68  TLC 92% DLCO 101% Interpretation: Mild obstructive defect is present. FEV1/FVC ratio <70%.  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 Has had multiple exacerbations in the last 4 months. Chronic macrolide not an option due to severe diarrhea related to antibiotic use. Discussed clinical course and management of COPD including bronchodilator regimen and action plan for exacerbation. She is more compliant with inhalers now and not using nebulizers as frequently. Consider pulm rehab in the future.   COPD-asthma overlap --CONTINUE Symbicort 80-4.5 mcg TWO puffs TWICE a day --CONTINUE Albuterol nebulize AS NEEDED for shortness of breath --CONTINUE singulair 10 mg daily --Not a candidate for chronic macrolide (diarrhea)  Allergic Rhinitis --CONTINUE Flonase 2 sprays daily  Vertigo --REFILL Antivert 50 mg as needed. Provided 1 month supply. No refills --Advised to discuss with PCP for further refilles  Peri-operative Assessment of Pulmonary  Risk for Non-Thoracic Surgery:  Post-op pulmonary risk is intermediate with recent infections. ARISCAT 13.3% risk of in-hospital post-op pulmonary complications (composite including respiratory failure, respiratory infection, pleural effusion, atelectasis, pneumothorax, bronchospasm, aspiration pneumonitis). However if able to remain infection free, post-op pulmonary is low (1.3%)  For [name], risk of perioperative pulmonary complications is increased by:  [ X ]  Age greater than 65 years  [ X] COPD/asthma  [ ]  Serum albumin <3.5  [ ] Smoking  [ ] Obstructive sleep apnea  [ ]  NYHA Class II Pulmonary Hypertension  Respiratory complications generally occur in 1% of ASA Class I patients, 5% of ASA Class II and 10% of ASA Class III-IV patients These complications rarely result in mortality and include postoperative pneumonia, atelectasis, pulmonary embolism, ARDS and increased time requiring postoperative mechanical ventilation.  Overall, I recommend proceeding with the surgery if the risk for respiratory complications are outweighed by the potential benefits. This will need to be discussed between the patient and surgeon.  To reduce risks of respiratory complications, I recommend: --Pre- and post-operative incentive spirometry performed frequently while awake --Inpatient use of currently prescribed positive-pressure for OSA whenever the patient is sleeping --Avoiding use of pancuronium during anesthesia.  I have discussed the risk factors and recommendations above with the patient.   Health Maintenance Immunization History  Administered Date(s) Administered   Fluad Quad(high Dose 65+) 09/02/2021   Influenza Split 08/26/2011, 08/03/2013   Influenza Whole 09/11/2010   Influenza, High Dose Seasonal PF 07/11/2012, 08/19/2014, 08/06/2015, 08/22/2018, 07/25/2019   Influenza,inj,quad, With Preservative 09/24/2020   PFIZER(Purple Top)SARS-COV-2 Vaccination 11/16/2019, 12/07/2019, 07/25/2020,  02/09/2021, 05/19/2022   Pneumococcal Conjugate-13 08/08/2014   Pneumococcal Polysaccharide-23 05/11/2010, 08/13/2012, 05/19/2022   Zoster, Live 05/08/2010, 11/23/2010   CT Lung Screen - not indicated  No orders of the defined types were placed in this encounter.  Meds ordered this encounter  Medications   meclizine (ANTIVERT) 50 MG tablet    Sig: Take 1 tablet (50 mg total) by mouth as needed.    Dispense:  30 tablet    Refill:  0   budesonide-formoterol (SYMBICORT) 80-4.5 MCG/ACT inhaler    Sig: Inhale 2 puffs into the lungs 2 (two) times daily.    Dispense:  1 each    Refill:  5   Return in about 3 months (around 09/14/2022).  I have spent a total time of  35-minutes on the day of the appointment including chart review, data review, collecting history, coordinating care and discussing medical diagnosis and plan with the patient/family. Past medical history, allergies, medications were reviewed. Pertinent imaging, labs and tests included in this note have been reviewed and interpreted independently by me.  [name] [name] 05/21/2022, MD [facility] 06/14/2022 4:31 PM  Office Number [phone]

## 2022-06-14 NOTE — Patient Instructions (Addendum)
COPD-asthma overlap --CONTINUE Symbicort 80-4.5 mcg TWO puffs TWICE a day --CONTINUE Albuterol nebulize AS NEEDED for shortness of breath --CONTINUE singulair 10 mg daily --Not a candidate for chronic macrolide (diarrhea)  Peri-operative Assessment of Pulmonary Risk for Non-Thoracic Surgery:  Post-op pulmonary risk is intermediate with recent infections. ARISCAT 13.3% risk of in-hospital post-op pulmonary complications (composite including respiratory failure, respiratory infection, pleural effusion, atelectasis, pneumothorax, bronchospasm, aspiration pneumonitis). However if able to remain infection free, post-op pulmonary is low (1.3%)  For Ms. Juanetta Gosling, risk of perioperative pulmonary complications is increased by:  [ X ]Age greater than 65 years  [ X] COPD/asthma  [ ]  Serum albumin <3.5  [ ] Smoking  [ ] Obstructive sleep apnea  [ ]  NYHA Class II Pulmonary Hypertension  Respiratory complications generally occur in 1% of ASA Class I patients, 5% of ASA Class II and 10% of ASA Class III-IV patients These complications rarely result in mortality and include postoperative pneumonia, atelectasis, pulmonary embolism, ARDS and increased time requiring postoperative mechanical ventilation.  Overall, I recommend proceeding with the surgery if the risk for respiratory complications are outweighed by the potential benefits. This will need to be discussed between the patient and surgeon.  To reduce risks of respiratory complications, I recommend: --Pre- and post-operative incentive spirometry performed frequently while awake --Inpatient use of currently prescribed positive-pressure for OSA whenever the patient is sleeping --Avoiding use of pancuronium during anesthesia.   Follow-up with me in 3 months

## 2022-06-20 ENCOUNTER — Encounter: Payer: Self-pay | Admitting: Pulmonary Disease

## 2022-07-01 DIAGNOSIS — L603 Nail dystrophy: Secondary | ICD-10-CM | POA: Diagnosis not present

## 2022-07-01 DIAGNOSIS — L570 Actinic keratosis: Secondary | ICD-10-CM | POA: Diagnosis not present

## 2022-07-01 DIAGNOSIS — D0472 Carcinoma in situ of skin of left lower limb, including hip: Secondary | ICD-10-CM | POA: Diagnosis not present

## 2022-07-08 DIAGNOSIS — N183 Chronic kidney disease, stage 3 unspecified: Secondary | ICD-10-CM | POA: Diagnosis not present

## 2022-07-08 DIAGNOSIS — E039 Hypothyroidism, unspecified: Secondary | ICD-10-CM | POA: Diagnosis not present

## 2022-07-08 DIAGNOSIS — E7800 Pure hypercholesterolemia, unspecified: Secondary | ICD-10-CM | POA: Diagnosis not present

## 2022-07-12 DIAGNOSIS — Z0000 Encounter for general adult medical examination without abnormal findings: Secondary | ICD-10-CM | POA: Diagnosis not present

## 2022-07-12 DIAGNOSIS — F4320 Adjustment disorder, unspecified: Secondary | ICD-10-CM | POA: Diagnosis not present

## 2022-07-12 DIAGNOSIS — Z6827 Body mass index (BMI) 27.0-27.9, adult: Secondary | ICD-10-CM | POA: Diagnosis not present

## 2022-07-12 DIAGNOSIS — I480 Paroxysmal atrial fibrillation: Secondary | ICD-10-CM | POA: Diagnosis not present

## 2022-07-12 DIAGNOSIS — Z23 Encounter for immunization: Secondary | ICD-10-CM | POA: Diagnosis not present

## 2022-07-12 DIAGNOSIS — Z79899 Other long term (current) drug therapy: Secondary | ICD-10-CM | POA: Diagnosis not present

## 2022-07-12 DIAGNOSIS — N183 Chronic kidney disease, stage 3 unspecified: Secondary | ICD-10-CM | POA: Diagnosis not present

## 2022-07-12 DIAGNOSIS — E039 Hypothyroidism, unspecified: Secondary | ICD-10-CM | POA: Diagnosis not present

## 2022-07-12 DIAGNOSIS — R42 Dizziness and giddiness: Secondary | ICD-10-CM | POA: Diagnosis not present

## 2022-07-12 DIAGNOSIS — E7800 Pure hypercholesterolemia, unspecified: Secondary | ICD-10-CM | POA: Diagnosis not present

## 2022-08-02 DIAGNOSIS — Z85828 Personal history of other malignant neoplasm of skin: Secondary | ICD-10-CM | POA: Diagnosis not present

## 2022-08-02 DIAGNOSIS — L570 Actinic keratosis: Secondary | ICD-10-CM | POA: Diagnosis not present

## 2022-08-02 DIAGNOSIS — L821 Other seborrheic keratosis: Secondary | ICD-10-CM | POA: Diagnosis not present

## 2022-08-02 DIAGNOSIS — L853 Xerosis cutis: Secondary | ICD-10-CM | POA: Diagnosis not present

## 2022-08-09 DIAGNOSIS — Z9849 Cataract extraction status, unspecified eye: Secondary | ICD-10-CM | POA: Diagnosis not present

## 2022-08-09 DIAGNOSIS — H524 Presbyopia: Secondary | ICD-10-CM | POA: Diagnosis not present

## 2022-08-09 DIAGNOSIS — H1045 Other chronic allergic conjunctivitis: Secondary | ICD-10-CM | POA: Diagnosis not present

## 2022-08-09 DIAGNOSIS — H53143 Visual discomfort, bilateral: Secondary | ICD-10-CM | POA: Diagnosis not present

## 2022-08-09 DIAGNOSIS — Z961 Presence of intraocular lens: Secondary | ICD-10-CM | POA: Diagnosis not present

## 2022-08-09 DIAGNOSIS — H52223 Regular astigmatism, bilateral: Secondary | ICD-10-CM | POA: Diagnosis not present

## 2022-08-09 DIAGNOSIS — H5203 Hypermetropia, bilateral: Secondary | ICD-10-CM | POA: Diagnosis not present

## 2022-08-28 DIAGNOSIS — Z23 Encounter for immunization: Secondary | ICD-10-CM | POA: Diagnosis not present

## 2022-08-31 ENCOUNTER — Telehealth: Payer: Self-pay | Admitting: Cardiology

## 2022-08-31 NOTE — Telephone Encounter (Signed)
Pt c/o swelling: STAT is pt has developed SOB within 24 hours  How much weight have you gained and in what time span? 6 lbs in last 4 or 5 months  If swelling, where is the swelling located? In right leg.   Are you currently taking a fluid pill? No  Are you currently SOB? Patient states she always has SOB   Do you have a log of your daily weights (if so, list)? No   Have you gained 3 pounds in a day or 5 pounds in a week? No   Have you traveled recently? No

## 2022-08-31 NOTE — Telephone Encounter (Signed)
Returned call to pt she states that "for the last few months " she has had some swelling in her right leg in the afternoons or evenings. She states that he swelling goes away in the morning when she awakes. She states that she is "always" SOB, so she is not SOB from this swelling. She denies any pain in the RLE. She states that her weight is up and down in the last 4-5 months she has gained 6 pounds. She would like to come in and discuss this with Dr Percival Spanish, as it is very concerning for her. Appt scheduled for 11-15 at 10:20 am.

## 2022-09-01 ENCOUNTER — Other Ambulatory Visit: Payer: Self-pay | Admitting: Pulmonary Disease

## 2022-09-06 DIAGNOSIS — R0602 Shortness of breath: Secondary | ICD-10-CM | POA: Insufficient documentation

## 2022-09-06 NOTE — Progress Notes (Unsigned)
Cardiology Office Note   Date:  09/08/2022   ID:  [name] [name], DOB [birth year], MRN [id number]  PCP:  Daisy Floro, MD  Cardiologist:   Rollene Rotunda, MD Referring:  Self  Chief Complaint  Patient presents with   Shortness of Breath      History of Present Illness: [name] [name] is a 76 y.o. female who presents for follow up of CAD  She underwent cardiac CTA reported 08/20/2020.  Her calcium score was 74 53rd percentile for age and sex CAD was present with LAD stenosis 50 to 69% left circumflex 25 to 49% and FFR was normal in both vessels.  She has severe COPD and asthma.  She had SOB at the last visit.  I sent her for a coronary CTA and she had non obstructive CAD.    She continues to have shortness of breath.  She has been seen by pulmonary.  She has a chronic cough.  Has not been having any fevers or chills.  She does have a little lower extremity swelling and wonders if some of her breathing could be related to fluid.  She is not having any new chest pressure, neck or arm discomfort.  She is not having any palpitations, presyncope or syncope.  She is very limited by joint and back pains.  Past Medical History:  Diagnosis Date   Arthritis    Asthma    Atrial fibrillation (HCC)    C. difficile diarrhea    COPD (chronic obstructive pulmonary disease) (HCC)    High cholesterol    Hyperlipemia    IBS (irritable bowel syndrome)    Lymphocytic colitis    Renal disorder    Thyroid disease     Past Surgical History:  Procedure Laterality Date   ROTATOR CUFF REPAIR Right 2014   TONSILLECTOMY       Current Outpatient Medications  Medication Sig Dispense Refill   acetaminophen (TYLENOL) 325 MG tablet Take 1-3 tablets by mouth as needed.     albuterol (PROVENTIL) (2.5 MG/3ML) 0.083% nebulizer solution Take 3 mLs (2.5 mg total) by nebulization every 6 (six) hours as needed for wheezing or shortness of breath. 75 mL 3   Ascorbic Acid (VITAMIN C PO) Take 1  tablet by mouth daily.     aspirin (ASPIRIN LOW DOSE) 81 MG EC tablet Take 1 tablet (81 mg total) by mouth 5 (five) times daily. Patient needs appointment for further refills. 1 st attempt 30 tablet 0   B Complex-C (SUPER B COMPLEX PO) Take 1 tablet by mouth daily.     budesonide (ENTOCORT EC) 3 MG 24 hr capsule Take 1-2 capsules by mouth as needed.     budesonide-formoterol (SYMBICORT) 80-4.5 MCG/ACT inhaler Inhale 2 puffs into the lungs 2 (two) times daily. 1 each 5   chlorpheniramine-HYDROcodone (TUSSIONEX PENNKINETIC ER) 10-8 MG/5ML Take 5 mLs by mouth at bedtime as needed for cough. 70 mL 0   Cholecalciferol (VITAMIN D3) 10 MCG (400 UNIT) tablet Take 1 tablet by mouth daily.     famotidine (PEPCID) 20 MG tablet Take 20 mg by mouth at bedtime.     furosemide (LASIX) 20 MG tablet Take 1 tablet (20 mg total) by mouth as needed. 30 tablet 1   levothyroxine (SYNTHROID) 88 MCG tablet Take 88 mcg by mouth daily.     meclizine (ANTIVERT) 50 MG tablet Take 1 tablet (50 mg total) by mouth as needed. 30 tablet 0   Melatonin 5 MG CAPS  Take 1 tablet by mouth at bedtime.     montelukast (SINGULAIR) 10 MG tablet TAKE 1 TABLET BY MOUTH EVERYDAY AT BEDTIME 90 tablet 1   nitroGLYCERIN (NITROSTAT) 0.4 MG SL tablet Place 1 tablet (0.4 mg total) under the tongue every 5 (five) minutes as needed for chest pain. 90 tablet 3   rosuvastatin (CRESTOR) 10 MG tablet Take 1 tablet (10 mg total) by mouth daily. 90 tablet 3   sertraline (ZOLOFT) 50 MG tablet Take 50 mg by mouth daily.      fluticasone (FLONASE) 50 MCG/ACT nasal spray SPRAY 2 SPRAYS INTO EACH NOSTRIL EVERY DAY (Patient not taking: Reported on 09/08/2022) 48 mL 2   Current Facility-Administered Medications  Medication Dose Route Frequency Provider Last Rate Last Admin   albuterol (PROVENTIL) (2.5 MG/3ML) 0.083% nebulizer solution 2.5 mg  2.5 mg Nebulization Once Cobb, Ruby Cola, NP        Allergies:   Shellfish allergy, Prednisone, and Sulfonamide  derivatives    ROS:  Please see the history of present illness.   Otherwise, review of systems are positive for back pain.   All other systems are reviewed and negative.   CT:   2023  RCA is a large dominant artery that gives rise to PDA and PLA. There is no significant plaque. Stair step artifact proximal and mid RCA.   Left main is a large artery that gives rise to LAD and LCX arteries. There is no significant plaque.   LAD is a large vessel that gives rise to one large D1 Branch. Minimal non-obstructive calcified plaque (1-24%) in proximal LAD followed by a mild non-obstructive calcified plaque (25-49%). Mild non-obstructive mixed plaque (25-49%) in mid LAD at the D1 takeoff.   LCX is a non-dominant artery that gives rise to one large OM1 branch. Mild non-obstructive calcified plaque (25-49%) in mid LAD at the OM1 takeoff. Stair step artifact mid OM1.  PHYSICAL EXAM: VS:  BP 128/64 (BP Location: Left Arm, Patient Position: Sitting, Cuff Size: Normal)   Pulse 85   Ht 5\' 8"  (1.727 m)   Wt 180 lb 6.4 oz (81.8 kg)   SpO2 94%   BMI 27.43 kg/m  , BMI Body mass index is 27.43 kg/m. GENERAL:  Well appearing NECK:  No jugular venous distention, waveform within normal limits, carotid upstroke brisk and symmetric, no bruits, no thyromegaly LUNGS:  Clear to auscultation bilaterally CHEST:  Unremarkable HEART:  PMI not displaced or sustained,S1 and S2 within normal limits, no S3, no S4, no clicks, no rubs, no murmurs ABD:  Flat, positive bowel sounds normal in frequency in pitch, no bruits, no rebound, no guarding, no midline pulsatile mass, no hepatomegaly, no splenomegaly EXT:  2 plus pulses throughout, no edema, no cyanosis no clubbing  EKG:  EKG is not ordered today.    Recent Labs: 12/22/2021: Hemoglobin 13.5; Platelets 224.0    Lipid Panel    Component Value Date/Time   CHOL 148 03/15/2022 0932   TRIG 122 03/15/2022 0932   HDL 55 03/15/2022 0932   CHOLHDL 2.7  03/15/2022 0932   LDLCALC 71 03/15/2022 0932      Wt Readings from Last 3 Encounters:  09/08/22 180 lb 6.4 oz (81.8 kg)  06/14/22 182 lb 9.6 oz (82.8 kg)  03/05/22 184 lb (83.5 kg)      Other studies Reviewed: Additional studies/ records that were reviewed today include:  Pulmonary records Review of the above records demonstrates:  Please see elsewhere in the note.  ASSESSMENT AND PLAN:  CAD: She has nonobstructive CAD.  We are going to follow this closely with risk reduction.  I do not see any evidence that this is contributing to her shortness of breath.   DYSLIPIDEMIA:   She agrees somewhat hesitantly to increase her Crestor to 10 mg daily and I will check a lipid profile again in 3 months with goal LDL less than 70.  Her LDL was 80 most recently after I slightly increased her Crestor.   SOB: I do not strongly feel that this is related to heart failure but I will check a BNP level and give her a trial of a few days of Lasix.  This is most likely related to her pulmonary disease.  Current medicines are reviewed at length with the patient today.  The patient does not have concerns regarding medicines.  The following changes have been made: As above  Labs/ tests ordered today include:   Orders Placed This Encounter  Procedures   Brain natriuretic peptide   Lipid panel     Disposition:   FU with an APP in 6 months   Okay Signed, Rollene Rotunda, MD  09/08/2022 11:26 AM    [facility]

## 2022-09-08 ENCOUNTER — Encounter: Payer: Self-pay | Admitting: Cardiology

## 2022-09-08 ENCOUNTER — Ambulatory Visit: Payer: Medicare Other | Attending: Cardiology | Admitting: Cardiology

## 2022-09-08 VITALS — BP 128/64 | HR 85 | Ht 68.0 in | Wt 180.4 lb

## 2022-09-08 DIAGNOSIS — R0602 Shortness of breath: Secondary | ICD-10-CM | POA: Diagnosis not present

## 2022-09-08 DIAGNOSIS — E785 Hyperlipidemia, unspecified: Secondary | ICD-10-CM | POA: Insufficient documentation

## 2022-09-08 DIAGNOSIS — I2510 Atherosclerotic heart disease of native coronary artery without angina pectoris: Secondary | ICD-10-CM | POA: Insufficient documentation

## 2022-09-08 MED ORDER — ROSUVASTATIN CALCIUM 10 MG PO TABS: 10.0000 mg | ORAL_TABLET | Freq: Every day | ORAL | 3 refills | Status: DC

## 2022-09-08 MED ORDER — FUROSEMIDE 20 MG PO TABS: 20.0000 mg | ORAL_TABLET | ORAL | 1 refills | Status: DC | PRN

## 2022-09-08 NOTE — Patient Instructions (Addendum)
Medication Instructions:    Lasix ( furosemide) 20 mg  take for 2 to 3 days then take as needed    Increase Rosuvastatin 10 mg daily   *If you need a refill on your cardiac medications before your next appointment, please call your pharmacy*   Lab Work:  BNP - now   LIPID - in 3 months  - fasting  FEB 2024   If you have labs (blood work) drawn today and your tests are completely normal, you will receive your results only by: MyChart Message (if you have MyChart) OR A paper copy in the mail If you have any lab test that is abnormal or we need to change your treatment, we will call you to review the results.   Testing/Procedures:  Not needed  Follow-Up: At [facility], you and your health needs are our priority.  As part of our continuing mission to provide you with exceptional heart care, we have created designated Provider Care Teams.  These Care Teams include your primary Cardiologist (physician) and Advanced Practice Providers (APPs -  Physician Assistants and Nurse Practitioners) who all work together to provide you with the care you need, when you need it.     Your next appointment:   6 month(s)  The format for your next appointment:   In Person  Provider:   Bernadene Person NP , Juanda Crumble PA , Marjie Skiff

## 2022-09-10 ENCOUNTER — Encounter: Payer: Self-pay | Admitting: Cardiology

## 2022-09-10 LAB — BRAIN NATRIURETIC PEPTIDE: BNP: 102.2 pg/mL — ABNORMAL HIGH (ref 0.0–100.0)

## 2022-09-13 DIAGNOSIS — H9113 Presbycusis, bilateral: Secondary | ICD-10-CM | POA: Diagnosis not present

## 2022-09-13 DIAGNOSIS — K219 Gastro-esophageal reflux disease without esophagitis: Secondary | ICD-10-CM | POA: Insufficient documentation

## 2022-09-13 DIAGNOSIS — H90A21 Sensorineural hearing loss, unilateral, right ear, with restricted hearing on the contralateral side: Secondary | ICD-10-CM | POA: Diagnosis not present

## 2022-09-13 DIAGNOSIS — H9313 Tinnitus, bilateral: Secondary | ICD-10-CM | POA: Diagnosis not present

## 2022-09-13 DIAGNOSIS — H90A32 Mixed conductive and sensorineural hearing loss, unilateral, left ear with restricted hearing on the contralateral side: Secondary | ICD-10-CM | POA: Diagnosis not present

## 2022-09-13 MED ORDER — NITROGLYCERIN 0.4 MG SL SUBL: SUBLINGUAL_TABLET | SUBLINGUAL | 3 refills | Status: DC

## 2022-09-13 NOTE — Telephone Encounter (Signed)
Spoke with patient and gave her BNP results per Dr. Antoine Poche: "Her BMP was essentially normal.  I have not strongly suspected heart failure as a cause of her shortness of breath.  No change in therapy other than as mentioned in the office note." Patient stated she only took lasix once. Her legs are edematous; rt greater than left. She also reports cramping in rt calf, but does not want to take anything for it. She stated she will take a lasix this morning. Renewed her NTG (bottle was expired). Denies having any chest discomfort.

## 2022-09-14 DIAGNOSIS — Z6826 Body mass index (BMI) 26.0-26.9, adult: Secondary | ICD-10-CM | POA: Diagnosis not present

## 2022-09-14 DIAGNOSIS — Z1151 Encounter for screening for human papillomavirus (HPV): Secondary | ICD-10-CM | POA: Diagnosis not present

## 2022-09-14 DIAGNOSIS — Z124 Encounter for screening for malignant neoplasm of cervix: Secondary | ICD-10-CM | POA: Diagnosis not present

## 2022-09-21 ENCOUNTER — Encounter (HOSPITAL_BASED_OUTPATIENT_CLINIC_OR_DEPARTMENT_OTHER): Payer: Self-pay | Admitting: Pulmonary Disease

## 2022-09-24 ENCOUNTER — Encounter (HOSPITAL_BASED_OUTPATIENT_CLINIC_OR_DEPARTMENT_OTHER): Payer: Self-pay | Admitting: Pulmonary Disease

## 2022-09-28 NOTE — Telephone Encounter (Signed)
Msg has been responded to. Closing this encounter.

## 2022-10-02 ENCOUNTER — Other Ambulatory Visit: Payer: Self-pay | Admitting: Cardiology

## 2022-10-06 DIAGNOSIS — E039 Hypothyroidism, unspecified: Secondary | ICD-10-CM | POA: Diagnosis not present

## 2022-10-06 DIAGNOSIS — N183 Chronic kidney disease, stage 3 unspecified: Secondary | ICD-10-CM | POA: Diagnosis not present

## 2022-10-07 DIAGNOSIS — Z1231 Encounter for screening mammogram for malignant neoplasm of breast: Secondary | ICD-10-CM | POA: Diagnosis not present

## 2022-11-09 ENCOUNTER — Encounter: Payer: Self-pay | Admitting: Emergency Medicine

## 2022-11-09 ENCOUNTER — Ambulatory Visit (INDEPENDENT_AMBULATORY_CARE_PROVIDER_SITE_OTHER): Payer: Medicare Other | Admitting: Emergency Medicine

## 2022-11-09 VITALS — BP 122/64 | HR 101 | Temp 97.6°F | Ht 68.0 in | Wt 174.4 lb

## 2022-11-09 DIAGNOSIS — J449 Chronic obstructive pulmonary disease, unspecified: Secondary | ICD-10-CM | POA: Diagnosis not present

## 2022-11-09 DIAGNOSIS — R058 Other specified cough: Secondary | ICD-10-CM | POA: Diagnosis not present

## 2022-11-09 MED ORDER — BUDESONIDE-FORMOTEROL FUMARATE 80-4.5 MCG/ACT IN AERO: 2.0000 | INHALATION_SPRAY | Freq: Two times a day (BID) | RESPIRATORY_TRACT | 5 refills | Status: DC

## 2022-11-09 NOTE — Assessment & Plan Note (Addendum)
Acute visit today but this is actually more subacute-chronic problem.  Probably a bit worse since December.  She brings up clear mucus in the mornings, otherwise nonproductive.  Will try to ramp back up therapy for likely contributing factors.  Please continue your Symbicort 80/4.5 mcg, 2 puffs twice a day.  Rinse and gargle after using Keep albuterol available to use 2 puffs if needed for shortness of breath, chest tightness, wheezing. Continue your Singulair 10 mg once daily but start taking this in the evening. Continue cetirizine 10 mg every day.  Start taking this medication every day on a schedule. Start taking your fluticasone nasal spray, 2 sprays each nostril once daily every day on a schedule Okay to continue chlor tabs as needed in the evening for allergy symptoms. Start taking Pepcid 20 mg twice a day every day on a schedule.  Depending on how your cough does we may decide to change this to an alternative reflux medication Okay to continue to use generic Tussionex as needed for cough suppression Try to avoid throat clearing if at all possible.  It can be helpful to use another mentholated cough drops or sugar-free candy.  When you have the urge to clear your throat, just swallow. Follow with Dr. Loanne Drilling or APP in 1 month to discuss your progress and to plan next steps.

## 2022-11-09 NOTE — Progress Notes (Signed)
Subjective:    Patient ID: [name] [name], female    DOB: 04/11/1946, 77 y.o.   MRN: [id number]  HPI  Acute visit 11/09/2022 --[name] [name] is 77, /  never smoker with mild intermittent asthma/COPD that is labile, usually characterized by cough when she is having flaring symptoms. never smoker with mild intermittent asthma/COPD that is labile, usually characterized by cough when she is having flaring symptoms.  Complicating this is that she has evidence for upper airway irritation syndrome that also resulted in cough.  She is managed on Symbicort 80, Singulair 10 mg in the am, fluticasone nasal spray but only as needed.  Pepcid is on her med list, not currently taking. She does state that she is on omeprazole occasionally.  It looks like she has been treated with Tussionex before in the past. She takes OTC chor-tabs qhs Today she reports that her cough has been increased since about December. Her daughter moved in w her in mid December and hears it a lot, wanted her to be checked. She produces clear mucous in the am. She is not feeling overt GERD but knows she has benefited from treatment for this in the past. Rare albuterol use, usually for wheeze. She is clearing her throat often. She is using tussionex - not every day.    Review of Systems As per HPI  Past Medical History:  Diagnosis Date   Arthritis    Asthma    Atrial fibrillation (HCC)    C. difficile diarrhea    COPD (chronic obstructive pulmonary disease) (HCC)    High cholesterol    Hyperlipemia    IBS (irritable bowel syndrome)    Lymphocytic colitis    Renal disorder    Thyroid disease      Family History  Problem Relation Age of Onset   Emphysema Mother    Heart disease Father    Rheum arthritis Maternal Grandmother    Rheum arthritis Son    Lupus Son    Sjogren's syndrome Son      Social History   Socioeconomic History   Marital status: Divorced    Spouse name: Not on file   Number of children: 2   Years of education: Not on file   Highest education level: Not on file  Occupational History   Occupation: retired  Tobacco Use   Smoking  status: Former    Years: 1.00    Types: Cigarettes    Start date: 10/1961    Quit date: 09/1962    Years since quitting: 60.1   Smokeless tobacco: Never   Tobacco comments:    passive cigarette smoke exposure, smoked in high school  Vaping Use   Vaping Use: Never used  Substance and Sexual Activity   Alcohol use: Yes    Comment: 3 times per week   Drug use: No   Sexual activity: Not on file  Other Topics Concern   Not on file  Social History Narrative   Not on file   Social Determinants of Health   Financial Resource Strain: Not on file  Food Insecurity: Not on file  Transportation Needs: Not on file  Physical Activity: Not on file  Stress: Not on file  Social Connections: Not on file  Intimate Partner Violence: Not on file     Allergies  Allergen Reactions   Shellfish Allergy Swelling   Prednisone Other (See Comments)    Face turns red   Sulfonamide Derivatives Nausea Only     Outpatient Medications Prior to Visit  Medication Sig Dispense Refill   acetaminophen (TYLENOL) 325 MG tablet Take 1-3 tablets by mouth  as needed.     albuterol (PROVENTIL) (2.5 MG/3ML) 0.083% nebulizer solution Take 3 mLs (2.5 mg total) by nebulization every 6 (six) hours as needed for wheezing or shortness of breath. 75 mL 3   Ascorbic Acid (VITAMIN C PO) Take 1 tablet by mouth daily.     aspirin (ASPIRIN LOW DOSE) 81 MG EC tablet Take 1 tablet (81 mg total) by mouth 5 (five) times daily. Patient needs appointment for further refills. 1 st attempt 30 tablet 0   B Complex-C (SUPER B COMPLEX PO) Take 1 tablet by mouth daily.     budesonide (ENTOCORT EC) 3 MG 24 hr capsule Take 1-2 capsules by mouth as needed.     chlorpheniramine-HYDROcodone (TUSSIONEX PENNKINETIC ER) 10-8 MG/5ML Take 5 mLs by mouth at bedtime as needed for cough. 70 mL 0   Cholecalciferol (VITAMIN D3) 10 MCG (400 UNIT) tablet Take 1 tablet by mouth daily.     fluticasone (FLONASE) 50 MCG/ACT nasal spray SPRAY 2 SPRAYS INTO  EACH NOSTRIL EVERY DAY 48 mL 2   furosemide (LASIX) 20 MG tablet TAKE 1 TABLET BY MOUTH AS NEEDED 90 tablet 3   levothyroxine (SYNTHROID) 88 MCG tablet Take 88 mcg by mouth daily.     meclizine (ANTIVERT) 50 MG tablet Take 1 tablet (50 mg total) by mouth as needed. 30 tablet 0   Melatonin 5 MG CAPS Take 1 tablet by mouth at bedtime.     montelukast (SINGULAIR) 10 MG tablet TAKE 1 TABLET BY MOUTH EVERYDAY AT BEDTIME 90 tablet 1   nitroGLYCERIN (NITROSTAT) 0.4 MG SL tablet For chest pain, tightness, or pressure. While sitting, place 1 tablet under tongue. May be used every 5 minutes as needed, for up to 15 minutes. Do not use more than 3 tablets. 25 tablet 3   rosuvastatin (CRESTOR) 10 MG tablet Take 1 tablet (10 mg total) by mouth daily. 90 tablet 3   sertraline (ZOLOFT) 50 MG tablet Take 50 mg by mouth daily.      budesonide-formoterol (SYMBICORT) 80-4.5 MCG/ACT inhaler Inhale 2 puffs into the lungs 2 (two) times daily. 1 each 5   famotidine (PEPCID) 20 MG tablet Take 20 mg by mouth at bedtime. (Patient not taking: Reported on 11/09/2022)     Facility-Administered Medications Prior to Visit  Medication Dose Route Frequency Provider Last Rate Last Admin   albuterol (PROVENTIL) (2.5 MG/3ML) 0.083% nebulizer solution 2.5 mg  2.5 mg Nebulization Once Cobb, Ruby Cola, NP            Objective:   Physical Exam Vitals:   11/09/22 1109  BP: 122/64  Pulse: (!) 101  Temp: 97.6 F (36.4 C)  TempSrc: Oral  SpO2: 96%  Weight: 174 lb 6.4 oz (79.1 kg)  Height: 5\' 8"  (1.727 m)   Gen: Pleasant, well-nourished, in no distress,  normal affect, freq throat clearing  ENT: No lesions,  mouth clear,  oropharynx clear, no postnasal drip  Neck: No JVD, some UA noise with insp and cough but no overt stridor  Lungs: No use of accessory muscles, no crackles or wheezing on normal respiration, no wheeze on forced expiration  Cardiovascular: RRR, heart sounds normal, no murmur or gallops, no peripheral  edema  Musculoskeletal: No deformities, no cyanosis or clubbing  Neuro: alert, awake, non focal  Skin: Warm, no lesions or rash      Assessment & Plan:  Upper airway cough syndrome Acute visit today but this is actually more subacute-chronic problem.  Probably a bit  worse since December.  She brings up clear mucus in the mornings, otherwise nonproductive.  Will try to ramp back up therapy for likely contributing factors.  Please continue your Symbicort 80/4.5 mcg, 2 puffs twice a day.  Rinse and gargle after using Keep albuterol available to use 2 puffs if needed for shortness of breath, chest tightness, wheezing. Continue your Singulair 10 mg once daily but start taking this in the evening. Continue cetirizine 10 mg every day.  Start taking this medication every day on a schedule. Start taking your fluticasone nasal spray, 2 sprays each nostril once daily every day on a schedule Okay to continue chlor tabs as needed in the evening for allergy symptoms. Start taking Pepcid 20 mg twice a day every day on a schedule.  Depending on how your cough does we may decide to change this to an alternative reflux medication Okay to continue to use generic Tussionex as needed for cough suppression Try to avoid throat clearing if at all possible.  It can be helpful to use another mentholated cough drops or sugar-free candy.  When you have the urge to clear your throat, just swallow. Follow with Dr. Loanne Drilling or APP in 1 month to discuss your progress and to plan next steps.  COPD (chronic obstructive pulmonary disease) (HCC) Probably a component of fixed asthma although her chronic cough seems to be the principal symptom.  She is on Symbicort 80, does not always remember to take the second dose.  It has been many years since she has had pulmonary function testing.  Would be reasonable to repeat if we believe her labile symptoms are due to lower airways disease.   Baltazar Apo, MD, PhD 11/09/2022, 12:56  PM [facility] [phone] or if no answer before 7:00PM call [phone] For any issues after 7:00PM please call eLink [phone]

## 2022-11-09 NOTE — Assessment & Plan Note (Signed)
Probably a component of fixed asthma although her chronic cough seems to be the principal symptom.  She is on Symbicort 80, does not always remember to take the second dose.  It has been many years since she has had pulmonary function testing.  Would be reasonable to repeat if we believe her labile symptoms are due to lower airways disease.

## 2022-11-09 NOTE — Patient Instructions (Addendum)
Please continue your Symbicort 80/4.5 mcg, 2 puffs twice a day.  Rinse and gargle after using Keep albuterol available to use 2 puffs if needed for shortness of breath, chest tightness, wheezing. We will probably need to repeat your pulmonary function testing at some point in the near future. Continue your Singulair 10 mg once daily but start taking this in the evening. Continue cetirizine 10 mg every day.  Start taking this medication every day on a schedule. Start taking your fluticasone nasal spray, 2 sprays each nostril once daily every day on a schedule Okay to continue chlor tabs as needed in the evening for allergy symptoms. Start taking Pepcid 20 mg twice a day every day on a schedule.  Depending on how your cough does we may decide to change this to an alternative reflux medication Okay to continue to use generic Tussionex as needed for cough suppression Try to avoid throat clearing if at all possible.  It can be helpful to use another mentholated cough drops or sugar-free candy.  When you have the urge to clear your throat, just swallow. Follow with Dr. Loanne Drilling or APP in 1 month to discuss your progress and to plan next steps.

## 2022-11-10 DIAGNOSIS — Z85828 Personal history of other malignant neoplasm of skin: Secondary | ICD-10-CM | POA: Diagnosis not present

## 2022-11-10 DIAGNOSIS — C44729 Squamous cell carcinoma of skin of left lower limb, including hip: Secondary | ICD-10-CM | POA: Diagnosis not present

## 2022-11-15 ENCOUNTER — Telehealth: Payer: Self-pay | Admitting: Cardiology

## 2022-11-15 ENCOUNTER — Encounter: Payer: Self-pay | Admitting: Cardiology

## 2022-11-15 DIAGNOSIS — E785 Hyperlipidemia, unspecified: Secondary | ICD-10-CM

## 2022-11-15 NOTE — Telephone Encounter (Signed)
Pt c/o medication issue:  1. Name of Medication:   rosuvastatin (CRESTOR) 10 MG tablet   2. How are you currently taking this medication (dosage and times per day)?   As prescribed  3. Are you having a reaction (difficulty breathing--STAT)?   No  4. What is your medication issue?   Patient stated she is having joint and muscle pain while taking this medication.  Patient stated she would like to get alternate medication.

## 2022-11-15 NOTE — Telephone Encounter (Signed)
Spoke with pt, aware okay to stop the rosuvastatin. She is currently in a meeting and will have to call us back.

## 2022-11-16 NOTE — Telephone Encounter (Signed)
Patient stated her muscle pain has gotten worse since the increase dose of rosuvastatin. Instructed to stop taking rosuvastatin. She stated Dr. Percival Spanish mentioned another medication. Please advise.

## 2022-11-17 MED ORDER — EZETIMIBE 10 MG PO TABS: 10.0000 mg | ORAL_TABLET | Freq: Every day | ORAL | 3 refills | Status: DC

## 2022-11-17 NOTE — Telephone Encounter (Signed)
Minus Breeding, MD     Her LDL was only 71 so OK to go back to the lower dose of statin that she did tolerate and start Zetia 10 mg daily.    Spoke with pt, aware of dr hochrein recommendations. New script sent to the pharmacy

## 2022-11-17 NOTE — Telephone Encounter (Signed)
See my chart message

## 2022-12-13 ENCOUNTER — Ambulatory Visit (INDEPENDENT_AMBULATORY_CARE_PROVIDER_SITE_OTHER): Payer: Medicare Other | Admitting: Pulmonary Disease

## 2022-12-13 ENCOUNTER — Encounter (HOSPITAL_BASED_OUTPATIENT_CLINIC_OR_DEPARTMENT_OTHER): Payer: Self-pay | Admitting: Pulmonary Disease

## 2022-12-13 VITALS — BP 118/60 | HR 76 | Ht 68.0 in | Wt 173.0 lb

## 2022-12-13 DIAGNOSIS — R058 Other specified cough: Secondary | ICD-10-CM

## 2022-12-13 MED ORDER — BREZTRI AEROSPHERE 160-9-4.8 MCG/ACT IN AERO: 2.0000 | INHALATION_SPRAY | Freq: Two times a day (BID) | RESPIRATORY_TRACT | 0 refills | Status: DC

## 2022-12-13 NOTE — Patient Instructions (Signed)
Chronic cough COPD-asthma overlap --Hold Symbicort 80-4.5 mcg for now --START Breztri sample for two weeks  Follow-up with me (telephone call) in 2 weeks

## 2022-12-13 NOTE — Progress Notes (Unsigned)
Subjective:   PATIENT ID: [name] [name] GENDER: female DOB: [birth year], MRN: [id number]   HPI  Chief Complaint  Patient presents with   Follow-up    Lingering cough   Reason for Visit: Follow-up  [name] [name] is a 77 year old female with paroxysmal atrial fibrillation, hypothyroidism, CKD stage III who presents for follow-up  Initial Consult 12/08/20 She was previously evaluated at [facility] pulmonary by Dr. Lamonte Sakai and Dr. Joya Gaskins. Lost to follow-up after 01/2012 and re-established with  Pulmonary in 11/2020 with me. She reports long standing cough for >10 years. Associated with wheezing especially with exertion including when walking upstairs or exercise. Her symptoms were similar when she was in [place] vs [place]. Has tried diet restrictions and PPI without improvement. She usually plays golf once a week but has noticed she will get out of breath easily.   2022 - COPD exacerbation in May (COVID-19), September, November 2023 - COPD exacerbation in January, February, June. Intermittently adherent to her Symbicort  12/13/22 Since our last visit she was seen in January for subacute/chronic cough. Advised to take Symbicort more consistently. Recommended to take cetirizine and fluticasone and pepcid. Rx PRN tussionex for cough suppression. Unable to tell if symbicort is working. Still having a non-productive cough with occasional wheezing. Has been taking tussionex cough syrup as needed. Has been compliant with pepcid. Has been taking nasal sprays.  Prior inhalers/meds: Spiriva - dc due to hoarseness Chronic macrolide - diarrhea  Social History: Never smoker Significant secondhand smoke exposure Previously worked in [employer], assisted in moving in Midwife No wood burning stove in the house Son - SI  Past Medical History:  Diagnosis Date   Arthritis    Asthma    Atrial fibrillation (HCC)    C. difficile diarrhea    COPD (chronic  obstructive pulmonary disease) (HCC)    High cholesterol    Hyperlipemia    IBS (irritable bowel syndrome)    Lymphocytic colitis    Renal disorder    Thyroid disease      Family History  Problem Relation Age of Onset   Emphysema Mother    Heart disease Father    Rheum arthritis Maternal Grandmother    Rheum arthritis Son    Lupus Son    Sjogren's syndrome Son      Social History   Occupational History   Occupation: retired  Tobacco Use   Smoking status: Former    Years: 1.00    Types: Cigarettes    Start date: 10/1961    Quit date: 09/1962    Years since quitting: 60.2   Smokeless tobacco: Never   Tobacco comments:    passive cigarette smoke exposure, smoked in high school  Vaping Use   Vaping Use: Never used  Substance and Sexual Activity   Alcohol use: Yes    Comment: 3 times per week   Drug use: No   Sexual activity: Not on file    Allergies  Allergen Reactions   Shellfish Allergy Swelling   Prednisone Other (See Comments)    Face turns red   Sulfonamide Derivatives Nausea Only     Outpatient Medications Prior to Visit  Medication Sig Dispense Refill   acetaminophen (TYLENOL) 325 MG tablet Take 1-3 tablets by mouth as needed.     albuterol (PROVENTIL) (2.5 MG/3ML) 0.083% nebulizer solution Take 3 mLs (2.5 mg total) by nebulization every 6 (six) hours as needed for wheezing or shortness of breath. 75 mL 3  Ascorbic Acid (VITAMIN C PO) Take 1 tablet by mouth daily.     aspirin (ASPIRIN LOW DOSE) 81 MG EC tablet Take 1 tablet (81 mg total) by mouth 5 (five) times daily. Patient needs appointment for further refills. 1 st attempt 30 tablet 0   B Complex-C (SUPER B COMPLEX PO) Take 1 tablet by mouth daily.     budesonide (ENTOCORT EC) 3 MG 24 hr capsule Take 1-2 capsules by mouth as needed.     budesonide-formoterol (SYMBICORT) 80-4.5 MCG/ACT inhaler Inhale 2 puffs into the lungs 2 (two) times daily. 10.2 g 5   chlorpheniramine-HYDROcodone (TUSSIONEX  PENNKINETIC ER) 10-8 MG/5ML Take 5 mLs by mouth at bedtime as needed for cough. 70 mL 0   Cholecalciferol (VITAMIN D3) 10 MCG (400 UNIT) tablet Take 1 tablet by mouth daily.     ezetimibe (ZETIA) 10 MG tablet Take 1 tablet (10 mg total) by mouth daily. 90 tablet 3   famotidine (PEPCID) 20 MG tablet Take 20 mg by mouth at bedtime.     fluticasone (FLONASE) 50 MCG/ACT nasal spray SPRAY 2 SPRAYS INTO EACH NOSTRIL EVERY DAY 48 mL 2   furosemide (LASIX) 20 MG tablet TAKE 1 TABLET BY MOUTH AS NEEDED 90 tablet 3   levothyroxine (SYNTHROID) 88 MCG tablet Take 88 mcg by mouth daily.     meclizine (ANTIVERT) 50 MG tablet Take 1 tablet (50 mg total) by mouth as needed. 30 tablet 0   Melatonin 5 MG CAPS Take 1 tablet by mouth at bedtime.     montelukast (SINGULAIR) 10 MG tablet TAKE 1 TABLET BY MOUTH EVERYDAY AT BEDTIME 90 tablet 1   nitroGLYCERIN (NITROSTAT) 0.4 MG SL tablet For chest pain, tightness, or pressure. While sitting, place 1 tablet under tongue. May be used every 5 minutes as needed, for up to 15 minutes. Do not use more than 3 tablets. 25 tablet 3   rosuvastatin (CRESTOR) 10 MG tablet Take 1 tablet (10 mg total) by mouth daily. 90 tablet 3   sertraline (ZOLOFT) 50 MG tablet Take 50 mg by mouth daily.      Facility-Administered Medications Prior to Visit  Medication Dose Route Frequency Provider Last Rate Last Admin   albuterol (PROVENTIL) (2.5 MG/3ML) 0.083% nebulizer solution 2.5 mg  2.5 mg Nebulization Once Cobb, Karie Schwalbe, NP        Review of Systems  Constitutional:  Negative for chills, diaphoresis, fever, malaise/fatigue and weight loss.  HENT:  Negative for congestion.   Respiratory:  Positive for cough. Negative for hemoptysis, sputum production, shortness of breath and wheezing.   Cardiovascular:  Negative for chest pain, palpitations and leg swelling.   Objective:   Vitals:   12/13/22 1128  BP: 118/60  Pulse: 76  SpO2: 96%  Weight: 173 lb (78.5 kg)  Height: 5' 8"$  (1.727  m)    Physical Exam: General: Well-appearing, no acute distress HENT: [place], AT Eyes: EOMI, no scleral icterus Respiratory: Clear to auscultation bilaterally.  No crackles, wheezing or rales Cardiovascular: RRR, -M/R/G, no JVD Extremities:-Edema,-tenderness Neuro: AAO x4, CNII-XII grossly intact Psych: Normal mood, normal affect  Data Reviewed:  Imaging: CXR 10/22/11 - Mild hyperinflation. No infiltrate, effusion or edema CT Coronary lung fields 08/20/20 - no evidence of interstitial/parenchymal changes, no infiltrate or ground glass opacities CXR 12/01/21 - Hyperinflation CT Coronary 03/20/23 - Visualized lung parenchyma with no pulmonary nodules, masses, infiltrate, effusion or pneumothorax.  PFT: 09/21/10 FVC 3.25 (93%) FEV1 2.3 (90%) Ratio 68  TLC 92% DLCO  101% Interpretation: Mild obstructive defect is present. FEV1/FVC ratio <70%.  No significant bronchodilator response  Labs: CBC    Component Value Date/Time   WBC 6.6 12/22/2021 0953   RBC 4.27 12/22/2021 0953   HGB 13.5 12/22/2021 0953   HCT 40.4 12/22/2021 0953   PLT 224.0 12/22/2021 0953   MCV 94.5 12/22/2021 0953   MCHC 33.4 12/22/2021 0953   RDW 12.5 12/22/2021 0953   LYMPHSABS 1.4 12/22/2021 0953   MONOABS 0.6 12/22/2021 0953   EOSABS 0.5 12/22/2021 0953   BASOSABS 0.0 12/22/2021 0953   Absolute eos 12/14/2021-500 IgE - 8 Assessment & Plan:   Discussion:  77 year old female with COPD-asthma overlap, paroxysmal atrial fibrillation, hypothyroidism, CKD stage III who presents for follow-up. Continues to have persistent symptoms despite compliance with ICS/LABA. Will trial with addition of LAMA. Discussed clinical course and management of COPD/asthma including bronchodilator regimen and action plan for exacerbation.  Chronic cough COPD-asthma overlap --Hold Symbicort 80-4.5 mcg for now --START Breztri sample for two weeks --CONTINUE Albuterol nebulizer AS NEEDED for shortness of breath --CONTINUE singulair 10  mg daily --Not a candidate for chronic macrolide (diarrhea)  Allergic Rhinitis --CONTINUE Flonase 2 sprays daily   Health Maintenance Immunization History  Administered Date(s) Administered   Fluad Quad(high Dose 65+) 09/02/2021   Influenza Split 08/26/2011, 08/03/2013   Influenza Whole 09/11/2010   Influenza, High Dose Seasonal PF 07/11/2012, 08/19/2014, 08/06/2015, 08/22/2018, 07/25/2019   Influenza,inj,quad, With Preservative 09/24/2020   PFIZER(Purple Top)SARS-COV-2 Vaccination 11/16/2019, 12/07/2019, 07/25/2020, 02/09/2021, 05/19/2022   Pneumococcal Conjugate-13 08/08/2014   Pneumococcal Polysaccharide-23 05/11/2010, 08/13/2012, 05/19/2022   Respiratory Syncytial Virus Vaccine,Recomb Aduvanted(Arexvy) 10/20/2022, 10/21/2022   Td 04/11/2022   Zoster, Live 05/08/2010, 11/23/2010   CT Lung Screen - not indicated  No orders of the defined types were placed in this encounter.  Meds ordered this encounter  Medications   Budeson-Glycopyrrol-Formoterol (BREZTRI AEROSPHERE) 160-9-4.8 MCG/ACT AERO    Sig: Inhale 2 puffs into the lungs in the morning and at bedtime.    Dispense:  5.9 g    Refill:  0    Order Specific Question:   Manufacturer?    Answer:   [employer] [[id number]]   Return in about 2 weeks (around 12/27/2022).  I have spent a total time of 31-minutes on the day of the appointment including chart review, data review, collecting history, coordinating care and discussing medical diagnosis and plan with the patient/family. Past medical history, allergies, medications were reviewed. Pertinent imaging, labs and tests included in this note have been reviewed and interpreted independently by me.  [place], MD [facility] 12/13/2022 11:29 AM  Office Number [phone]

## 2022-12-15 ENCOUNTER — Encounter (HOSPITAL_BASED_OUTPATIENT_CLINIC_OR_DEPARTMENT_OTHER): Payer: Self-pay | Admitting: Pulmonary Disease

## 2022-12-16 ENCOUNTER — Telehealth: Payer: Self-pay | Admitting: Cardiology

## 2022-12-16 DIAGNOSIS — E7800 Pure hypercholesterolemia, unspecified: Secondary | ICD-10-CM | POA: Diagnosis not present

## 2022-12-16 DIAGNOSIS — E039 Hypothyroidism, unspecified: Secondary | ICD-10-CM | POA: Diagnosis not present

## 2022-12-16 MED ORDER — ROSUVASTATIN CALCIUM 5 MG PO TABS: 5.0000 mg | ORAL_TABLET | Freq: Every day | ORAL | 3 refills | Status: DC

## 2022-12-16 NOTE — Telephone Encounter (Signed)
Returned call to patient who states that she is currently taking Rosuvastatin 83m daily and is not taking Zetia due to the price. Patient would like to know what Dr. HPercival Spanishthinks about current FLP and let her know of any recommendations from here. Advised patient I would forward message over.

## 2022-12-16 NOTE — Telephone Encounter (Signed)
Patient called saying she had a lipid panel at her PCP office today, she is calling with the results:  Lipid panel with reflex Cholesterol 140 Chol/HDL 2.6 HDLD 54 Triglycerides 145 NHDL 87 LDL CHOL CALC (NIH) 62  Patient would like for nurse to give her a call.

## 2022-12-16 NOTE — Telephone Encounter (Signed)
Minus Breeding, MD  YouJust now (5:14 PM)    I think it is fine on current treatment.   Returned call to patient and made her aware. Chart updated to reflect changes.

## 2022-12-21 DIAGNOSIS — Z8262 Family history of osteoporosis: Secondary | ICD-10-CM | POA: Diagnosis not present

## 2022-12-21 DIAGNOSIS — M5136 Other intervertebral disc degeneration, lumbar region: Secondary | ICD-10-CM | POA: Diagnosis not present

## 2022-12-21 DIAGNOSIS — N958 Other specified menopausal and perimenopausal disorders: Secondary | ICD-10-CM | POA: Diagnosis not present

## 2022-12-21 DIAGNOSIS — M816 Localized osteoporosis [Lequesne]: Secondary | ICD-10-CM | POA: Diagnosis not present

## 2022-12-31 ENCOUNTER — Telehealth (HOSPITAL_BASED_OUTPATIENT_CLINIC_OR_DEPARTMENT_OTHER): Payer: Medicare Other | Admitting: Pulmonary Disease

## 2023-01-06 DIAGNOSIS — M79641 Pain in right hand: Secondary | ICD-10-CM | POA: Diagnosis not present

## 2023-01-06 DIAGNOSIS — M47896 Other spondylosis, lumbar region: Secondary | ICD-10-CM | POA: Diagnosis not present

## 2023-01-11 DIAGNOSIS — M180 Bilateral primary osteoarthritis of first carpometacarpal joints: Secondary | ICD-10-CM | POA: Diagnosis not present

## 2023-01-11 DIAGNOSIS — M79642 Pain in left hand: Secondary | ICD-10-CM | POA: Diagnosis not present

## 2023-01-11 DIAGNOSIS — M1812 Unilateral primary osteoarthritis of first carpometacarpal joint, left hand: Secondary | ICD-10-CM | POA: Insufficient documentation

## 2023-01-11 DIAGNOSIS — M79641 Pain in right hand: Secondary | ICD-10-CM | POA: Diagnosis not present

## 2023-01-18 DIAGNOSIS — M810 Age-related osteoporosis without current pathological fracture: Secondary | ICD-10-CM | POA: Diagnosis not present

## 2023-01-26 DIAGNOSIS — L821 Other seborrheic keratosis: Secondary | ICD-10-CM | POA: Diagnosis not present

## 2023-01-26 DIAGNOSIS — L814 Other melanin hyperpigmentation: Secondary | ICD-10-CM | POA: Diagnosis not present

## 2023-01-26 DIAGNOSIS — L578 Other skin changes due to chronic exposure to nonionizing radiation: Secondary | ICD-10-CM | POA: Diagnosis not present

## 2023-01-26 DIAGNOSIS — Z85828 Personal history of other malignant neoplasm of skin: Secondary | ICD-10-CM | POA: Diagnosis not present

## 2023-01-26 DIAGNOSIS — C4492 Squamous cell carcinoma of skin, unspecified: Secondary | ICD-10-CM | POA: Diagnosis not present

## 2023-02-01 DIAGNOSIS — M545 Low back pain, unspecified: Secondary | ICD-10-CM | POA: Diagnosis not present

## 2023-02-01 DIAGNOSIS — W19XXXA Unspecified fall, initial encounter: Secondary | ICD-10-CM | POA: Diagnosis not present

## 2023-02-01 DIAGNOSIS — M533 Sacrococcygeal disorders, not elsewhere classified: Secondary | ICD-10-CM | POA: Diagnosis not present

## 2023-02-15 DIAGNOSIS — M47816 Spondylosis without myelopathy or radiculopathy, lumbar region: Secondary | ICD-10-CM | POA: Diagnosis not present

## 2023-02-28 DIAGNOSIS — C44729 Squamous cell carcinoma of skin of left lower limb, including hip: Secondary | ICD-10-CM | POA: Diagnosis not present

## 2023-03-04 DIAGNOSIS — M79642 Pain in left hand: Secondary | ICD-10-CM | POA: Diagnosis not present

## 2023-03-04 DIAGNOSIS — M180 Bilateral primary osteoarthritis of first carpometacarpal joints: Secondary | ICD-10-CM | POA: Diagnosis not present

## 2023-03-05 ENCOUNTER — Other Ambulatory Visit: Payer: Self-pay | Admitting: Pulmonary Disease

## 2023-03-14 DIAGNOSIS — B999 Unspecified infectious disease: Secondary | ICD-10-CM | POA: Diagnosis not present

## 2023-03-14 DIAGNOSIS — Z4802 Encounter for removal of sutures: Secondary | ICD-10-CM | POA: Diagnosis not present

## 2023-03-29 DIAGNOSIS — Z5189 Encounter for other specified aftercare: Secondary | ICD-10-CM | POA: Diagnosis not present

## 2023-03-29 DIAGNOSIS — Z85828 Personal history of other malignant neoplasm of skin: Secondary | ICD-10-CM | POA: Diagnosis not present

## 2023-05-06 DIAGNOSIS — M180 Bilateral primary osteoarthritis of first carpometacarpal joints: Secondary | ICD-10-CM | POA: Diagnosis not present

## 2023-05-06 DIAGNOSIS — M79642 Pain in left hand: Secondary | ICD-10-CM | POA: Diagnosis not present

## 2023-05-16 ENCOUNTER — Encounter (HOSPITAL_BASED_OUTPATIENT_CLINIC_OR_DEPARTMENT_OTHER): Payer: Self-pay | Admitting: Orthopedic Surgery

## 2023-05-17 ENCOUNTER — Encounter (HOSPITAL_BASED_OUTPATIENT_CLINIC_OR_DEPARTMENT_OTHER)
Admission: RE | Admit: 2023-05-17 | Discharge: 2023-05-17 | Disposition: A | Discharge status: Home / Self Care | Payer: Medicare Other | Source: Ambulatory Visit | Attending: Orthopedic Surgery | Admitting: Orthopedic Surgery

## 2023-05-17 DIAGNOSIS — Z01810 Encounter for preprocedural cardiovascular examination: Secondary | ICD-10-CM | POA: Diagnosis not present

## 2023-05-17 DIAGNOSIS — I2510 Atherosclerotic heart disease of native coronary artery without angina pectoris: Secondary | ICD-10-CM | POA: Insufficient documentation

## 2023-05-17 DIAGNOSIS — Z01812 Encounter for preprocedural laboratory examination: Secondary | ICD-10-CM | POA: Insufficient documentation

## 2023-05-17 LAB — BASIC METABOLIC PANEL
Anion gap: 9 (ref 5–15)
BUN: 14 mg/dL (ref 8–23)
CO2: 24 mmol/L (ref 22–32)
Calcium: 9.3 mg/dL (ref 8.9–10.3)
Chloride: 106 mmol/L (ref 98–111)
Creatinine, Ser: 1.14 mg/dL — ABNORMAL HIGH (ref 0.44–1.00)
GFR, Estimated: 49 mL/min — ABNORMAL LOW (ref >60)
Glucose, Bld: 101 mg/dL — ABNORMAL HIGH (ref 70–99)
Potassium: 4.5 mmol/L (ref 3.5–5.1)
Sodium: 139 mmol/L (ref 135–145)

## 2023-05-25 ENCOUNTER — Ambulatory Visit (HOSPITAL_BASED_OUTPATIENT_CLINIC_OR_DEPARTMENT_OTHER): Payer: Medicare Other | Admitting: Anesthesiology

## 2023-05-25 ENCOUNTER — Other Ambulatory Visit: Payer: Self-pay

## 2023-05-25 ENCOUNTER — Ambulatory Visit (HOSPITAL_BASED_OUTPATIENT_CLINIC_OR_DEPARTMENT_OTHER): Payer: Medicare Other

## 2023-05-25 ENCOUNTER — Encounter (HOSPITAL_BASED_OUTPATIENT_CLINIC_OR_DEPARTMENT_OTHER)
Admission: RE | Disposition: A | Discharge status: Home / Self Care | Payer: Self-pay | Source: Home / Self Care | Attending: Orthopedic Surgery

## 2023-05-25 ENCOUNTER — Encounter (HOSPITAL_BASED_OUTPATIENT_CLINIC_OR_DEPARTMENT_OTHER): Payer: Self-pay | Admitting: Orthopedic Surgery

## 2023-05-25 ENCOUNTER — Ambulatory Visit (HOSPITAL_BASED_OUTPATIENT_CLINIC_OR_DEPARTMENT_OTHER)
Admission: RE | Admit: 2023-05-25 | Discharge: 2023-05-25 | Disposition: A | Discharge status: Home / Self Care | Payer: Medicare Other | Source: Home / Self Care | Attending: Orthopedic Surgery | Admitting: Orthopedic Surgery

## 2023-05-25 DIAGNOSIS — I2510 Atherosclerotic heart disease of native coronary artery without angina pectoris: Secondary | ICD-10-CM

## 2023-05-25 DIAGNOSIS — M1812 Unilateral primary osteoarthritis of first carpometacarpal joint, left hand: Secondary | ICD-10-CM | POA: Insufficient documentation

## 2023-05-25 DIAGNOSIS — F32A Depression, unspecified: Secondary | ICD-10-CM | POA: Diagnosis not present

## 2023-05-25 DIAGNOSIS — J449 Chronic obstructive pulmonary disease, unspecified: Secondary | ICD-10-CM | POA: Diagnosis not present

## 2023-05-25 DIAGNOSIS — J4489 Other specified chronic obstructive pulmonary disease: Secondary | ICD-10-CM | POA: Diagnosis not present

## 2023-05-25 DIAGNOSIS — F17200 Nicotine dependence, unspecified, uncomplicated: Secondary | ICD-10-CM | POA: Diagnosis not present

## 2023-05-25 DIAGNOSIS — Z87891 Personal history of nicotine dependence: Secondary | ICD-10-CM | POA: Diagnosis not present

## 2023-05-25 DIAGNOSIS — Z8261 Family history of arthritis: Secondary | ICD-10-CM | POA: Insufficient documentation

## 2023-05-25 HISTORY — PX: CARPOMETACARPEL SUSPENSION PLASTY: SHX5005

## 2023-05-25 SURGERY — CARPOMETACARPEL (CMC) SUSPENSION PLASTY
Anesthesia: Regional | Site: Hand | Laterality: Left

## 2023-05-25 MED ORDER — OXYCODONE HCL 5 MG PO TABS: 5.0000 mg | ORAL_TABLET | Freq: Four times a day (QID) | ORAL | 0 refills | Status: AC | PRN

## 2023-05-25 MED ORDER — LACTATED RINGERS IV SOLN: INTRAVENOUS | Status: DC

## 2023-05-25 MED ORDER — ROPIVACAINE HCL 5 MG/ML IJ SOLN
INTRAMUSCULAR | Status: DC | PRN
Start: 2023-05-25 — End: 2023-05-25
  Administered 2023-05-25: 30 mL via PERINEURAL

## 2023-05-25 MED ORDER — PROPOFOL 500 MG/50ML IV EMUL
INTRAVENOUS | Status: DC | PRN
  Administered 2023-05-25 (×2): 75 ug/kg/min via INTRAVENOUS

## 2023-05-25 MED ORDER — ACETAMINOPHEN 10 MG/ML IV SOLN: 1000.0000 mg | Freq: Once | INTRAVENOUS | Status: DC | PRN

## 2023-05-25 MED ORDER — 0.9 % SODIUM CHLORIDE (POUR BTL) OPTIME
TOPICAL | Status: DC | PRN
  Administered 2023-05-25: 120 mL

## 2023-05-25 MED ORDER — ONDANSETRON 4 MG PO TBDP: 4.0000 mg | ORAL_TABLET | Freq: Three times a day (TID) | ORAL | 0 refills | Status: AC | PRN

## 2023-05-25 MED ORDER — MIDAZOLAM HCL 2 MG/2ML IJ SOLN
INTRAMUSCULAR | Status: AC
  Filled 2023-05-25: qty 2

## 2023-05-25 MED ORDER — ONDANSETRON HCL 4 MG/2ML IJ SOLN: 4.0000 mg | Freq: Once | INTRAMUSCULAR | Status: DC | PRN

## 2023-05-25 MED ORDER — ONDANSETRON HCL 4 MG/2ML IJ SOLN
INTRAMUSCULAR | Status: DC | PRN
  Administered 2023-05-25: 4 mg via INTRAVENOUS

## 2023-05-25 MED ORDER — AMISULPRIDE (ANTIEMETIC) 5 MG/2ML IV SOLN: 10.0000 mg | Freq: Once | INTRAVENOUS | Status: DC | PRN

## 2023-05-25 MED ORDER — PROPOFOL 10 MG/ML IV BOLUS
INTRAVENOUS | Status: DC | PRN
  Administered 2023-05-25: 20 mg via INTRAVENOUS
  Administered 2023-05-25 (×2): 10 mg via INTRAVENOUS
  Administered 2023-05-25 (×2): 20 mg via INTRAVENOUS

## 2023-05-25 MED ORDER — FENTANYL CITRATE (PF) 100 MCG/2ML IJ SOLN
INTRAMUSCULAR | Status: DC | PRN
  Administered 2023-05-25: 25 ug via INTRAVENOUS

## 2023-05-25 MED ORDER — ONDANSETRON HCL 4 MG/2ML IJ SOLN
INTRAMUSCULAR | Status: AC
  Filled 2023-05-25: qty 2

## 2023-05-25 MED ORDER — CEFAZOLIN SODIUM-DEXTROSE 2-4 GM/100ML-% IV SOLN
2.0000 g | INTRAVENOUS | Status: AC
  Administered 2023-05-25: 2 g via INTRAVENOUS

## 2023-05-25 MED ORDER — PROPOFOL 500 MG/50ML IV EMUL
INTRAVENOUS | Status: AC
  Filled 2023-05-25: qty 50

## 2023-05-25 MED ORDER — EPHEDRINE 5 MG/ML INJ
INTRAVENOUS | Status: AC
  Filled 2023-05-25: qty 5

## 2023-05-25 MED ORDER — FENTANYL CITRATE (PF) 100 MCG/2ML IJ SOLN
INTRAMUSCULAR | Status: AC
  Filled 2023-05-25: qty 2

## 2023-05-25 MED ORDER — FENTANYL CITRATE (PF) 100 MCG/2ML IJ SOLN: 25.0000 ug | INTRAMUSCULAR | Status: DC | PRN

## 2023-05-25 MED ORDER — CEFAZOLIN SODIUM-DEXTROSE 2-4 GM/100ML-% IV SOLN
INTRAVENOUS | Status: AC
  Filled 2023-05-25: qty 100

## 2023-05-25 MED ORDER — EPHEDRINE SULFATE (PRESSORS) 50 MG/ML IJ SOLN
INTRAMUSCULAR | Status: DC | PRN
Start: 2023-05-25 — End: 2023-05-25
  Administered 2023-05-25 (×3): 5 mg via INTRAVENOUS
  Administered 2023-05-25: 10 mg via INTRAVENOUS

## 2023-05-25 MED ORDER — MIDAZOLAM HCL 2 MG/2ML IJ SOLN
2.0000 mg | Freq: Once | INTRAMUSCULAR | Status: AC
  Administered 2023-05-25: 2 mg via INTRAVENOUS

## 2023-05-25 SURGICAL SUPPLY — 62 items
ADH SKN CLS APL DERMABOND .7 (GAUZE/BANDAGES/DRESSINGS)
ANCHOR FIBERLOCK SUSPENSION (Anchor) IMPLANT
APL PRP STRL LF DISP 70% ISPRP (MISCELLANEOUS) ×1
BLADE SURG 15 STRL LF DISP TIS (BLADE) ×1 IMPLANT
BLADE SURG 15 STRL SS (BLADE) ×2
BNDG CMPR 5X3 KNIT ELC UNQ LF (GAUZE/BANDAGES/DRESSINGS) ×1
BNDG CMPR 5X4 KNIT ELC UNQ LF (GAUZE/BANDAGES/DRESSINGS) ×1
BNDG CMPR 9X4 STRL LF SNTH (GAUZE/BANDAGES/DRESSINGS) ×1
BNDG ELASTIC 3INX 5YD STR LF (GAUZE/BANDAGES/DRESSINGS) IMPLANT
BNDG ELASTIC 4INX 5YD STR LF (GAUZE/BANDAGES/DRESSINGS) ×1 IMPLANT
BNDG ESMARK 4X9 LF (GAUZE/BANDAGES/DRESSINGS) ×1 IMPLANT
BNDG GAUZE DERMACEA FLUFF 4 (GAUZE/BANDAGES/DRESSINGS) ×1 IMPLANT
BNDG GZE DERMACEA 4 6PLY (GAUZE/BANDAGES/DRESSINGS) ×1
BNDG PLASTER X FAST 3X3 WHT LF (CAST SUPPLIES) IMPLANT
BNDG PLASTER X FAST 4X5 WHT LF (CAST SUPPLIES) IMPLANT
BNDG PLSTR 5X4 XFST ST WHT LF (CAST SUPPLIES)
BNDG PLSTR 9X3 FST ST WHT (CAST SUPPLIES)
CHLORAPREP W/TINT 26 (MISCELLANEOUS) ×1 IMPLANT
CORD BIPOLAR FORCEPS 12FT (ELECTRODE) ×1 IMPLANT
COVER BACK TABLE 60X90IN (DRAPES) ×1 IMPLANT
COVER MAYO STAND STRL (DRAPES) ×1 IMPLANT
CUFF TOURN SGL QUICK 18X4 (TOURNIQUET CUFF) IMPLANT
CUFF TOURN SGL QUICK 24 (TOURNIQUET CUFF)
CUFF TRNQT CYL 24X4X16.5-23 (TOURNIQUET CUFF) IMPLANT
DERMABOND ADVANCED .7 DNX12 (GAUZE/BANDAGES/DRESSINGS) IMPLANT
DRAPE EXTREMITY T 121X128X90 (DISPOSABLE) ×1 IMPLANT
DRAPE OEC MINIVIEW 54X84 (DRAPES) ×1 IMPLANT
DRAPE SURG 17X23 STRL (DRAPES) ×1 IMPLANT
GAUZE SPONGE 4X4 12PLY STRL (GAUZE/BANDAGES/DRESSINGS) ×1 IMPLANT
GAUZE XEROFORM 1X8 LF (GAUZE/BANDAGES/DRESSINGS) ×1 IMPLANT
GLOVE BIO SURGEON STRL SZ7 (GLOVE) ×1 IMPLANT
GLOVE BIOGEL PI IND STRL 7.0 (GLOVE) ×1 IMPLANT
GLOVE SURG SS PI 7.0 STRL IVOR (GLOVE) IMPLANT
GOWN STRL REUS W/ TWL LRG LVL3 (GOWN DISPOSABLE) ×2 IMPLANT
GOWN STRL REUS W/TWL LRG LVL3 (GOWN DISPOSABLE) ×2
K-WIRE DBL .045X4 NSTRL (WIRE)
K-WIRE DBL .062X4 NSTRL (WIRE) ×1
KWIRE DBL .045X4 NSTRL (WIRE) IMPLANT
KWIRE DBL .062X4 NSTRL (WIRE) IMPLANT
NDL HYPO 25X1 1.5 SAFETY (NEEDLE) IMPLANT
NEEDLE HYPO 25X1 1.5 SAFETY (NEEDLE)
NS IRRIG 1000ML POUR BTL (IV SOLUTION) ×1 IMPLANT
PACK BASIN DAY SURGERY FS (CUSTOM PROCEDURE TRAY) ×1 IMPLANT
PAD CAST 3X4 CTTN HI CHSV (CAST SUPPLIES) ×1 IMPLANT
PADDING CAST COTTON 3X4 STRL (CAST SUPPLIES) ×1
SLEEVE SCD COMPRESS KNEE MED (STOCKING) IMPLANT
SLING ARM FOAM STRAP LRG (SOFTGOODS) IMPLANT
SPLINT FIBERGLASS 4X30 (CAST SUPPLIES) ×1 IMPLANT
STRIP CLOSURE SKIN 1/2X4 (GAUZE/BANDAGES/DRESSINGS) IMPLANT
SUCTION TUBE FRAZIER 10FR DISP (SUCTIONS) IMPLANT
SUT ETHIBOND 3-0 V-5 (SUTURE) IMPLANT
SUT ETHILON 4 0 PS 2 18 (SUTURE) ×1 IMPLANT
SUT MERSILENE 4 0 P 3 (SUTURE) ×1 IMPLANT
SUT MNCRL AB 3-0 PS2 18 (SUTURE) IMPLANT
SUT MNCRL AB 4-0 PS2 18 (SUTURE) IMPLANT
SUT SUPRAMID 3-0 (SUTURE) IMPLANT
SUT VIC AB 4-0 PS2 18 (SUTURE) IMPLANT
SYR BULB EAR ULCER 3OZ GRN STR (SYRINGE) ×1 IMPLANT
SYR CONTROL 10ML LL (SYRINGE) IMPLANT
TOWEL GREEN STERILE FF (TOWEL DISPOSABLE) ×2 IMPLANT
TUBE CONNECTING 20X1/4 (TUBING) IMPLANT
UNDERPAD 30X36 HEAVY ABSORB (UNDERPADS AND DIAPERS) ×1 IMPLANT

## 2023-05-25 NOTE — Anesthesia Procedure Notes (Signed)
Anesthesia Regional Block: Supraclavicular block   Pre-Anesthetic Checklist: , timeout performed,  Correct Patient, Correct Site, Correct Laterality,  Correct Procedure, Correct Position, site marked,  Risks and benefits discussed,  Surgical consent,  Pre-op evaluation,  At surgeon's request and post-op pain management  Laterality: Left  Prep: chloraprep       Needles:  Injection technique: Single-shot  Needle Type: Echogenic Stimulator Needle     Needle Length: 9cm  Needle Gauge: 21     Additional Needles:   Procedures:,,,, ultrasound used (permanent image in chart),,    Narrative:  Start time: 05/25/2023 7:50 AM End time: 05/25/2023 8:00 AM Injection made incrementally with aspirations every 5 mL.  Performed by: Personally  Anesthesiologist: Leonides Grills, MD  Additional Notes: Functioning IV was confirmed and monitors were applied.  A timeout was performed. Sterile prep, hand hygiene and sterile gloves were used. A 90mm 21ga Arrow echogenic stimulator needle was used. Negative aspiration and negative test dose prior to incremental administration of local anesthetic. The patient tolerated the procedure well.  Ultrasound guidance: relevent anatomy identified, needle position confirmed, local anesthetic spread visualized around nerve(s), vascular puncture avoided.  Image printed for medical record.

## 2023-05-25 NOTE — Discharge Instructions (Addendum)
[name] [name], M.D. Hand Surgery  POST-OPERATIVE DISCHARGE INSTRUCTIONS   PRESCRIPTIONS: You may have been given a prescription to be taken as directed for post-operative pain control.  You may also take over the counter ibuprofen/aleve and tylenol for pain. Take this as directed on the packaging. Do not exceed 3000 mg tylenol/acetaminophen in 24 hours.  Ibuprofen 600-800 mg (3-4) tablets by mouth every 6 hours as needed for pain.  OR Aleve 2 tablets by mouth every 12 hours (twice daily) as needed for pain.  AND/OR Tylenol 1000 mg (2 tablets) every 8 hours as needed for pain.  Please use your pain medication carefully, as refills are limited and you may not be provided with one.  As stated above, please use over the counter pain medicine - it will also be helpful with decreasing your swelling.    ANESTHESIA: After your surgery, post-surgical discomfort or pain is likely. This discomfort can last several days to a few weeks. At certain times of the day your discomfort may be more intense.   Did you receive a nerve block?  A nerve block can provide pain relief for one hour to two days after your surgery. As long as the nerve block is working, you will experience little or no sensation in the area the surgeon operated on.  As the nerve block wears off, you will begin to experience pain or discomfort. It is very important that you begin taking your prescribed pain medication before the nerve block fully wears off. Treating your pain at the first sign of the block wearing off will ensure your pain is better controlled and more tolerable when full-sensation returns. Do not wait until the pain is intolerable, as the medicine will be less effective. It is better to treat pain in advance than to try and catch up.   General Anesthesia:  If you did not receive a nerve block during your surgery, you will need to start taking your pain medication shortly after your surgery and should continue  to do so as prescribed by your surgeon.     ICE AND ELEVATION: You may use ice for the first 48-72 hours, but it is not critical.   Motion of your fingers is very important to decrease the swelling.  Elevation, as much as possible for the next 48 hours, is critical for decreasing swelling as well as for pain relief. Elevation means when you are seated or lying down, you hand should be at or above your heart. When walking, the hand needs to be at or above the level of your elbow.  If the bandage gets too tight, it may need to be loosened. Please contact our office and we will instruct you in how to do this.    SURGICAL BANDAGES:  Keep your dressing and/or splint clean and dry at all times.  Do not remove until you are seen again in the office.  If careful, you may place a plastic bag over your bandage and tape the end to shower, but be careful, do not get your bandages wet.     HAND THERAPY:  You will be contacted to set up the date and time of your first therapy appointment.  Ideally, the first therapy visit will be on the same day as your first post op visit.    ACTIVITY AND WORK: You are encouraged to move any fingers which are not in the bandage.  Light use of the fingers is allowed to assist the other hand  with daily hygiene and eating, but strong gripping or lifting is often uncomfortable and should be avoided.  You might miss a variable period of time from work and hopefully this issue has been discussed prior to surgery. You may not do any heavy work with your affected hand for about 2 weeks.    [facility] Second Floor, [address] [place], [place] [address] [phone]    Post Anesthesia Home Care Instructions  Activity: Get plenty of rest for the remainder of the day. A responsible individual must stay with you for 24 hours following the procedure.  For the next 24 hours, DO NOT: -Drive a car -Advertising copywriter -Drink alcoholic beverages -Take any  medication unless instructed by your physician -Make any legal decisions or sign important papers.  Meals: Start with liquid foods such as gelatin or soup. Progress to regular foods as tolerated. Avoid greasy, spicy, heavy foods. If nausea and/or vomiting occur, drink only clear liquids until the nausea and/or vomiting subsides. Call your physician if vomiting continues.  Special Instructions/Symptoms: Your throat may feel dry or sore from the anesthesia or the breathing tube placed in your throat during surgery. If this causes discomfort, gargle with warm salt water. The discomfort should disappear within 24 hours.  If you had a scopolamine patch placed behind your ear for the management of post- operative nausea and/or vomiting:  1. The medication in the patch is effective for 72 hours, after which it should be removed.  Wrap patch in a tissue and discard in the trash. Wash hands thoroughly with soap and water. 2. You may remove the patch earlier than 72 hours if you experience unpleasant side effects which may include dry mouth, dizziness or visual disturbances. 3. Avoid touching the patch. Wash your hands with soap and water after contact with the patch.

## 2023-05-25 NOTE — H&P (Signed)
HAND SURGERY   HPI: Patient is a 77 y.o. female who presents with left thumb CMC osteoarthritis that has failed extensive non-surgical management and is significantly interfering with patient's quality of life.  Patient denies any changes to their medical history or new systemic symptoms today.    Past Medical History:  Diagnosis Date   Arthritis    Asthma    Atrial fibrillation (HCC)    C. difficile diarrhea    COPD (chronic obstructive pulmonary disease) (HCC)    High cholesterol    Hyperlipemia    IBS (irritable bowel syndrome)    Lymphocytic colitis    Renal disorder    Thyroid disease    Past Surgical History:  Procedure Laterality Date   ROTATOR CUFF REPAIR Right 2014   TONSILLECTOMY     Social History   Socioeconomic History   Marital status: Divorced    Spouse name: Not on file   Number of children: 2   Years of education: Not on file   Highest education level: Not on file  Occupational History   Occupation: retired  Tobacco Use   Smoking status: Former    Current packs/day: 0.00    Types: Cigarettes    Start date: 10/1961    Quit date: 09/1962    Years since quitting: 60.7   Smokeless tobacco: Never   Tobacco comments:    passive cigarette smoke exposure, smoked in high school  Vaping Use   Vaping status: Never Used  Substance and Sexual Activity   Alcohol use: Yes    Comment: 3 times per week   Drug use: No   Sexual activity: Not on file  Other Topics Concern   Not on file  Social History Narrative   Not on file   Social Determinants of Health   Financial Resource Strain: Not on file  Food Insecurity: Not on file  Transportation Needs: Not on file  Physical Activity: Not on file  Stress: Not on file  Social Connections: Unknown (01/12/2020)   Received from [facility], [facility]   Social Connections    Frequency of Communication with Friends and Family: Not asked    Frequency of Social Gatherings with Friends and Family: Not asked    Family History  Problem Relation Age of Onset   Emphysema Mother    Heart disease Father    Rheum arthritis Maternal Grandmother    Rheum arthritis Son    Lupus Son    Sjogren's syndrome Son    - negative except otherwise stated in the family history section Allergies  Allergen Reactions   Shellfish Allergy Swelling   Prednisone Other (See Comments)    Face turns red   Sulfonamide Derivatives Nausea Only   Prior to Admission medications   Medication Sig Start Date End Date Taking? Authorizing Provider  acetaminophen (TYLENOL) 325 MG tablet Take 1-3 tablets by mouth as needed.   Yes [provider]  albuterol (PROVENTIL) (2.5 MG/3ML) 0.083% nebulizer solution Take 3 mLs (2.5 mg total) by nebulization every 6 (six) hours as needed for wheezing or shortness of breath. 12/22/21  Yes Cobb, Ruby Cola, NP  Ascorbic Acid (VITAMIN C PO) Take 1 tablet by mouth daily.   Yes [provider]  B Complex-C (SUPER B COMPLEX PO) Take 1 tablet by mouth daily.   Yes [provider]  Budeson-Glycopyrrol-Formoterol (BREZTRI AEROSPHERE) 160-9-4.8 MCG/ACT AERO Inhale 2 puffs into the lungs in the morning and at bedtime. 12/13/22  Yes Luciano Cutter, MD  budesonide-formoterol (SYMBICORT) 80-4.5 MCG/ACT inhaler Inhale 2 puffs into the lungs 2 (two) times daily. 11/09/22  Yes Luciano Cutter, MD  Cholecalciferol (VITAMIN D3) 10 MCG (400 UNIT) tablet Take 1 tablet by mouth daily.   Yes [provider]  famotidine (PEPCID) 20 MG tablet Take 20 mg by mouth at bedtime.   Yes [provider]  fluticasone (FLONASE) 50 MCG/ACT nasal spray SPRAY 2 SPRAYS INTO EACH NOSTRIL EVERY DAY 03/19/22  Yes Luciano Cutter, MD  levothyroxine (SYNTHROID) 88 MCG tablet Take 88 mcg by mouth daily.   Yes [provider]  meclizine (ANTIVERT) 50 MG tablet Take 1 tablet (50 mg total) by mouth as needed. 06/14/22  Yes Luciano Cutter, MD  Melatonin 5 MG CAPS Take 1 tablet by  mouth at bedtime.   Yes [provider]  montelukast (SINGULAIR) 10 MG tablet TAKE 1 TABLET BY MOUTH EVERYDAY AT BEDTIME 03/07/23  Yes Luciano Cutter, MD  rosuvastatin (CRESTOR) 5 MG tablet Take 1 tablet (5 mg total) by mouth daily. 12/16/22  Yes Rollene Rotunda, MD  sertraline (ZOLOFT) 50 MG tablet Take 50 mg by mouth daily.    Yes [provider]  aspirin (ASPIRIN LOW DOSE) 81 MG EC tablet Take 1 tablet (81 mg total) by mouth 5 (five) times daily. Patient needs appointment for further refills. 1 st attempt 01/21/22   Baldo Daub, MD  furosemide (LASIX) 20 MG tablet TAKE 1 TABLET BY MOUTH AS NEEDED 10/05/22   Rollene Rotunda, MD  nitroGLYCERIN (NITROSTAT) 0.4 MG SL tablet For chest pain, tightness, or pressure. While sitting, place 1 tablet under tongue. May be used every 5 minutes as needed, for up to 15 minutes. Do not use more than 3 tablets. 09/13/22   Rollene Rotunda, MD   DG MINI C-ARM IMAGE ONLY  Result Date: 05/25/2023 There is no interpretation for this exam.  This order is for images obtained during a surgical procedure.  Please See "Surgeries" Tab for more information regarding the procedure.   - Positive ROS: All other systems have been reviewed and were otherwise negative with the exception of those mentioned in the HPI and as above.  Physical Exam: General: No acute distress, resting comfortably Cardiovascular: BUE warm and well perfused, normal rate Respiratory: Normal WOB on RA Skin: Warm and dry Neurologic: Sensation intact distally Psychiatric: Patient is at baseline mood and affect  Left Upper Extremity  Swelling and dorsal subluxation of thumb metacarpal at [facility] joint.  Pain and crepitus with CMC grind test.  Moderate palmar abduction contracture of thumb.  Minimal static/dynamic MCP hyper-extension with no pain w/ A/PROM of MCPJ.  SILT m/u/r distribution.  Hand warm and well perfused w/ BCR.    Assessment: 77 yo F w/ left thumb CMC osteoarthritis  that has failed extensive nonsurgical management.   Plan: OR today for left trapeziectomy with suspensionplasty. We again reviewed the risks of surgery which include, but are not limited to, bleeding, infection, damage to neurovascular structures, persistent symptoms, thumb stiffness, need for additional surgery.  Informed consent was signed.  All questions were answered.   Marlyne Beards, M.D. [facility] 7:32 AM

## 2023-05-25 NOTE — Progress Notes (Signed)
Assisted Dr. Ellender with left, supraclavicular, ultrasound guided block. Side rails up, monitors on throughout procedure. See vital signs in flow sheet. Tolerated Procedure well. 

## 2023-05-25 NOTE — Interval H&P Note (Signed)
History and Physical Interval Note:  05/25/2023 7:35 AM  [name] [name]  has presented today for surgery, with the diagnosis of Left thumb carpometacarpal arthritis.  The various methods of treatment have been discussed with the patient and family. After consideration of risks, benefits and other options for treatment, the patient has consented to  Procedure(s) with comments: THUMB TRAPEZIECTOMY WITH FIBERLOCK SUSPENSION PLASTY, POSSIBLE PARTIAL TRAPEZOIDECTOMY (Left) - regional 120 as a surgical intervention.  The patient's history has been reviewed, patient examined, no change in status, stable for surgery.  I have reviewed the patient's chart and labs.  Questions were answered to the patient's satisfaction.     Michie Molnar Allanah Mcfarland

## 2023-05-25 NOTE — Anesthesia Postprocedure Evaluation (Signed)
Anesthesia Post Note  Patient: [name] [name]  Procedure(s) Performed: THUMB TRAPEZIECTOMY WITH FIBERLOCK SUSPENSION PLASTY (Left: Hand)     Patient location during evaluation: PACU Anesthesia Type: Regional Level of consciousness: awake Pain management: pain level controlled Vital Signs Assessment: post-procedure vital signs reviewed and stable Respiratory status: spontaneous breathing, nonlabored ventilation and respiratory function stable Cardiovascular status: blood pressure returned to baseline and stable Postop Assessment: no apparent nausea or vomiting Anesthetic complications: no   No notable events documented.  Last Vitals:  Vitals:   05/25/23 1031 05/25/23 1045  BP:  131/68  Pulse:  85  Resp:  17  Temp:  (!) 36.2 C  SpO2: 93% 94%    Last Pain:  Vitals:   05/25/23 1045  TempSrc:   PainSc: 0-No pain                 Dervin Vore P [name]

## 2023-05-25 NOTE — Op Note (Signed)
Date of Surgery: 05/25/2023  INDICATIONS: Patient is a 77 y.o.-year-old female with left thumb carpometacarpal osteoarthritis that has failed extensive conservative management with activity modification, bracing, and corticosteroid injections.  The pain at the base of her thumb is interfering with her daily activities and significantly affecting her quality of life.  Risks, benefits, and alternatives to surgery were again discussed with the patient in the preoperative area. The patient wishes to proceed with surgery.  Informed consent was signed after our discussion.   PREOPERATIVE DIAGNOSIS:  Left thumb CMC osteoarthritis  POSTOPERATIVE DIAGNOSIS: Same.  PROCEDURE: Left trapeziectomy Left CMC arthroplasty with Fiberlock suspensionplasty   SURGEON: Waylan Rocher, M.D.  ASSIST: None  ANESTHESIA:  Regional, MAC  IV FLUIDS AND URINE: See anesthesia.  ESTIMATED BLOOD LOSS: 10 mL.  IMPLANTS:  Implant Name Type Inv. Item Serial No. Manufacturer Lot No. LRB No. Used Action  [facility] SUSPENSION - [id number] Anchor ANCHOR Denton Lank  [employer] [id number] Left 1 Implanted     DRAINS: None  COMPLICATIONS: None  DESCRIPTION OF PROCEDURE: The patient was met in the preoperative holding area where the surgical site was marked and the consent form was signed.  A regional block was performed by anesthesia.  The patient was then taken to the operating room and transferred to the operating table.  All bony prominences were well padded.  A tourniquet was applied to the left upper arm.  Monitored sedation was induced.  The operative extremity was prepped and draped in the usual and sterile fashion.  A formal time-out was performed to confirm that this was the correct patient, surgery, side, and site.   Following formal timeout, the limb was gently exsanguinated with an esmarch bandage and the tourniquet inflated to 250 mmHg.  A longitudinal incision was made over the dorsal  aspect of the thumb from just distal to the [facility] joint to just distal to the radial styloid.  The skin was incised.  Blunt dissection was used with a tenotomy scissor to identify branches of the superficial radial nerve.  These were protected throughout the surgery.  Small superficial veins were coagulated with bipolar cautery as needed.  The APL and EPB tendons were identified.  The fascia overlying the tendons was divided using a tenotomy scissor.  Blunt dissection was used to identify the branch of the radial artery overlying the trapezium.  The artery was dissected free of adjacent soft tissue as was the accompanying vein.  Small branches were coagulated to allow for mobilization of the vessels.  A dull Weitlaner retractor was placed in the wound retracting the radial artery and accompanying vein away from the overlying joint capsule.  A 15 blade was used to perform a capsulotomy over the dorsal aspect of the metacarpal and along the dorsal aspect of the trapezium.  The capsuloligamentous structures were then circumferentially divided from their insertion on the trapezium.  A McGlamry elevator was then used to divide the remaining capsular ligamentous structures taking care to protect the underlying flexor carpi radialis tendon.  The trapezium was removed en bloc.  There was significant eburnation of the articular surface.  There was similar eburnation of the base of the metacarpal.  A Freer elevator was used to inspect the articulation between the trapezoid and the scaphoid.  There were minimal degenerative changes present.  The base of the index metacarpal was identified.  A guidewire was then placed obliquely across the base of the index metacarpal exiting just distal to the interspace between the  second and third metacarpals.  AP and lateral views demonstrated appropriate guidewire placement.  The fibertak drill guide was then screwed into the base of the index metacarpal.  The previously placed guidewire was  removed and the path for the fiber lock anchor was drilled bicortically.  The anchor was then gently malleted into place.  Traction was pulled on the limbs of the suture which set the anchor.  I then turned my attention to the base of the thumb metacarpal.  A guidewire was then advanced at a 45 degree angle from the radial base of the thumb metacarpal midway between volar and dorsal.  This would ensure that the suture tape would sit nicely in the saddle of the metacarpal.  The previously placed guidewire was then overdrilled with the appropriate size drill.  The thumb was held in adducted position with slight traction.  The 2 limbs of the fiber tape were then inserted into the fork of the swivel lock anchor.  The anchor was then advanced into the base of the metacarpal with good bony purchase.  The anchor was buried below the subchondral bone.  With the suspension performed, the thumb had full passive adduction and abduction.  There was no subsidence with axial load.  AP and lateral fluoroscopic views demonstrated anatomic suspension of the thumb metacarpal.  At this point the wound was thoroughly irrigated with copious sterile saline.  The capsulotomy was repaired using a 4-0 Mersilene suture.  The tourniquet was deflated and hemostasis was achieved with direct pressure over the wound.  The hand was warm, pink, and well-perfused with the tourniquet deflated.  There was then closed using a 4-0 nylon suture in a horizontal mattress fashion.  Was then dressed with Xeroform, folded Kerlix, cast padding, and a well-padded thumb spica splint was applied.   The patient was reversed from sedation.  They were transferred from the operating table to the postoperative bed.  All counts were correct x 2 at the end of the procedure.  The patient was then taken to the PACU in stable condition.   POSTOPERATIVE PLAN: She will be discharged to home with appropriate pain medications and discharge instructions.  A referral has  been placed for hand therapy.  I will see her back in approximately 2 weeks for her first postop appointment.   Waylan Rocher, MD 10:11 AM

## 2023-05-25 NOTE — Transfer of Care (Signed)
Immediate Anesthesia Transfer of Care Note  Patient: [name] [name]  Procedure(s) Performed: THUMB TRAPEZIECTOMY WITH FIBERLOCK SUSPENSION PLASTY (Left: Hand)  Patient Location: PACU  Anesthesia Type:MAC combined with regional for post-op pain  Level of Consciousness: drowsy  Airway & Oxygen Therapy: Patient Spontanous Breathing and Patient connected to face mask oxygen  Post-op Assessment: Report given to RN and Post -op Vital signs reviewed and stable  Post vital signs: Reviewed and stable  Last Vitals:  Vitals Value Taken Time  BP 136/57 (76)   Temp 36.1 C 05/25/23 1011  Pulse 81   Resp 19   SpO2 97     Last Pain:  Vitals:   05/25/23 0640  TempSrc: Oral         Complications: No notable events documented.

## 2023-05-25 NOTE — Anesthesia Preprocedure Evaluation (Addendum)
Anesthesia Evaluation  Patient identified by MRN, date of birth, ID band Patient awake    Reviewed: Allergy & Precautions, NPO status , Patient's Chart, lab work & pertinent test results  Airway Mallampati: II  TM Distance: >3 FB Neck ROM: Full    Dental no notable dental hx.    Pulmonary asthma , COPD,  COPD inhaler, former smoker   Pulmonary exam normal        Cardiovascular Normal cardiovascular exam+ dysrhythmias Atrial Fibrillation      Neuro/Psych  PSYCHIATRIC DISORDERS  Depression     Neuromuscular disease    GI/Hepatic Neg liver ROS,GERD  Medicated and Controlled,,  Endo/Other  Hypothyroidism    Renal/GU Renal disease     Musculoskeletal  (+) Arthritis ,    Abdominal   Peds  Hematology negative hematology ROS (+)   Anesthesia Other Findings Left thumb carpometacarpal arthritis  Reproductive/Obstetrics                             Anesthesia Physical Anesthesia Plan  ASA: 3  Anesthesia Plan: Regional   Post-op Pain Management:    Induction: Intravenous  PONV Risk Score and Plan: 2 and Ondansetron, Dexamethasone, Propofol infusion, Treatment may vary due to age or medical condition and Midazolam  Airway Management Planned: Simple Face Mask  Additional Equipment:   Intra-op Plan:   Post-operative Plan:   Informed Consent: I have reviewed the patients History and Physical, chart, labs and discussed the procedure including the risks, benefits and alternatives for the proposed anesthesia with the patient or authorized representative who has indicated his/her understanding and acceptance.     Dental advisory given  Plan Discussed with: CRNA  Anesthesia Plan Comments:        Anesthesia Quick Evaluation

## 2023-05-26 ENCOUNTER — Encounter (HOSPITAL_BASED_OUTPATIENT_CLINIC_OR_DEPARTMENT_OTHER): Payer: Self-pay | Admitting: Orthopedic Surgery

## 2023-06-07 DIAGNOSIS — M79645 Pain in left finger(s): Secondary | ICD-10-CM | POA: Diagnosis not present

## 2023-06-08 DIAGNOSIS — M79645 Pain in left finger(s): Secondary | ICD-10-CM | POA: Insufficient documentation

## 2023-06-14 DIAGNOSIS — M25642 Stiffness of left hand, not elsewhere classified: Secondary | ICD-10-CM | POA: Diagnosis not present

## 2023-06-21 DIAGNOSIS — M180 Bilateral primary osteoarthritis of first carpometacarpal joints: Secondary | ICD-10-CM | POA: Diagnosis not present

## 2023-06-21 DIAGNOSIS — M1812 Unilateral primary osteoarthritis of first carpometacarpal joint, left hand: Secondary | ICD-10-CM | POA: Diagnosis not present

## 2023-06-21 DIAGNOSIS — M25642 Stiffness of left hand, not elsewhere classified: Secondary | ICD-10-CM | POA: Diagnosis not present

## 2023-06-28 DIAGNOSIS — M25642 Stiffness of left hand, not elsewhere classified: Secondary | ICD-10-CM | POA: Diagnosis not present

## 2023-06-29 DIAGNOSIS — L814 Other melanin hyperpigmentation: Secondary | ICD-10-CM | POA: Diagnosis not present

## 2023-06-29 DIAGNOSIS — L309 Dermatitis, unspecified: Secondary | ICD-10-CM | POA: Diagnosis not present

## 2023-06-29 DIAGNOSIS — Z85828 Personal history of other malignant neoplasm of skin: Secondary | ICD-10-CM | POA: Diagnosis not present

## 2023-06-29 DIAGNOSIS — D229 Melanocytic nevi, unspecified: Secondary | ICD-10-CM | POA: Diagnosis not present

## 2023-06-29 DIAGNOSIS — L820 Inflamed seborrheic keratosis: Secondary | ICD-10-CM | POA: Diagnosis not present

## 2023-06-29 DIAGNOSIS — L578 Other skin changes due to chronic exposure to nonionizing radiation: Secondary | ICD-10-CM | POA: Diagnosis not present

## 2023-06-29 DIAGNOSIS — L821 Other seborrheic keratosis: Secondary | ICD-10-CM | POA: Diagnosis not present

## 2023-07-07 DIAGNOSIS — M25642 Stiffness of left hand, not elsewhere classified: Secondary | ICD-10-CM | POA: Diagnosis not present

## 2023-07-12 DIAGNOSIS — M25642 Stiffness of left hand, not elsewhere classified: Secondary | ICD-10-CM | POA: Diagnosis not present

## 2023-07-15 DIAGNOSIS — M25642 Stiffness of left hand, not elsewhere classified: Secondary | ICD-10-CM | POA: Diagnosis not present

## 2023-07-19 DIAGNOSIS — M25642 Stiffness of left hand, not elsewhere classified: Secondary | ICD-10-CM | POA: Diagnosis not present

## 2023-07-26 DIAGNOSIS — M180 Bilateral primary osteoarthritis of first carpometacarpal joints: Secondary | ICD-10-CM | POA: Diagnosis not present

## 2023-07-26 DIAGNOSIS — M1812 Unilateral primary osteoarthritis of first carpometacarpal joint, left hand: Secondary | ICD-10-CM | POA: Diagnosis not present

## 2023-07-29 DIAGNOSIS — M25642 Stiffness of left hand, not elsewhere classified: Secondary | ICD-10-CM | POA: Diagnosis not present

## 2023-08-02 DIAGNOSIS — M25642 Stiffness of left hand, not elsewhere classified: Secondary | ICD-10-CM | POA: Diagnosis not present

## 2023-08-03 DIAGNOSIS — E039 Hypothyroidism, unspecified: Secondary | ICD-10-CM | POA: Diagnosis not present

## 2023-08-03 DIAGNOSIS — E559 Vitamin D deficiency, unspecified: Secondary | ICD-10-CM | POA: Diagnosis not present

## 2023-08-03 DIAGNOSIS — E611 Iron deficiency: Secondary | ICD-10-CM | POA: Diagnosis not present

## 2023-08-03 DIAGNOSIS — E782 Mixed hyperlipidemia: Secondary | ICD-10-CM | POA: Diagnosis not present

## 2023-08-03 DIAGNOSIS — Z23 Encounter for immunization: Secondary | ICD-10-CM | POA: Diagnosis not present

## 2023-08-03 DIAGNOSIS — I480 Paroxysmal atrial fibrillation: Secondary | ICD-10-CM | POA: Diagnosis not present

## 2023-08-03 DIAGNOSIS — N183 Chronic kidney disease, stage 3 unspecified: Secondary | ICD-10-CM | POA: Diagnosis not present

## 2023-08-09 DIAGNOSIS — M25642 Stiffness of left hand, not elsewhere classified: Secondary | ICD-10-CM | POA: Diagnosis not present

## 2023-08-10 DIAGNOSIS — Z6826 Body mass index (BMI) 26.0-26.9, adult: Secondary | ICD-10-CM | POA: Diagnosis not present

## 2023-08-10 DIAGNOSIS — N183 Chronic kidney disease, stage 3 unspecified: Secondary | ICD-10-CM | POA: Diagnosis not present

## 2023-08-10 DIAGNOSIS — J449 Chronic obstructive pulmonary disease, unspecified: Secondary | ICD-10-CM | POA: Diagnosis not present

## 2023-08-10 DIAGNOSIS — M2550 Pain in unspecified joint: Secondary | ICD-10-CM | POA: Diagnosis not present

## 2023-08-10 DIAGNOSIS — Z0000 Encounter for general adult medical examination without abnormal findings: Secondary | ICD-10-CM | POA: Diagnosis not present

## 2023-08-10 DIAGNOSIS — F4320 Adjustment disorder, unspecified: Secondary | ICD-10-CM | POA: Diagnosis not present

## 2023-08-10 DIAGNOSIS — K5289 Other specified noninfective gastroenteritis and colitis: Secondary | ICD-10-CM | POA: Diagnosis not present

## 2023-08-10 DIAGNOSIS — E039 Hypothyroidism, unspecified: Secondary | ICD-10-CM | POA: Diagnosis not present

## 2023-08-10 DIAGNOSIS — E782 Mixed hyperlipidemia: Secondary | ICD-10-CM | POA: Diagnosis not present

## 2023-08-11 ENCOUNTER — Telehealth: Payer: Self-pay | Admitting: Internal Medicine

## 2023-08-11 DIAGNOSIS — H26493 Other secondary cataract, bilateral: Secondary | ICD-10-CM | POA: Diagnosis not present

## 2023-08-11 DIAGNOSIS — H52223 Regular astigmatism, bilateral: Secondary | ICD-10-CM | POA: Diagnosis not present

## 2023-08-11 DIAGNOSIS — H53143 Visual discomfort, bilateral: Secondary | ICD-10-CM | POA: Diagnosis not present

## 2023-08-11 DIAGNOSIS — Z961 Presence of intraocular lens: Secondary | ICD-10-CM | POA: Diagnosis not present

## 2023-08-11 DIAGNOSIS — H5203 Hypermetropia, bilateral: Secondary | ICD-10-CM | POA: Diagnosis not present

## 2023-08-11 DIAGNOSIS — H1045 Other chronic allergic conjunctivitis: Secondary | ICD-10-CM | POA: Diagnosis not present

## 2023-08-11 DIAGNOSIS — H524 Presbyopia: Secondary | ICD-10-CM | POA: Diagnosis not present

## 2023-08-11 NOTE — Telephone Encounter (Signed)
Patient called stated she is having colitis flare and is seeking advise.

## 2023-08-12 NOTE — Telephone Encounter (Signed)
Pt calling stating she wanted an appt with Dr. Marina Goodell. Discussed with her his 1st available appt is 12/26. Pt scheduled to see Doug Sou PA 08/23/23 at 1:30pm. Pt aware of appt.

## 2023-08-15 DIAGNOSIS — Z1212 Encounter for screening for malignant neoplasm of rectum: Secondary | ICD-10-CM | POA: Diagnosis not present

## 2023-08-15 DIAGNOSIS — Z1211 Encounter for screening for malignant neoplasm of colon: Secondary | ICD-10-CM | POA: Diagnosis not present

## 2023-08-16 DIAGNOSIS — M25642 Stiffness of left hand, not elsewhere classified: Secondary | ICD-10-CM | POA: Diagnosis not present

## 2023-08-23 ENCOUNTER — Encounter: Payer: Self-pay | Admitting: Gastroenterology

## 2023-08-23 ENCOUNTER — Ambulatory Visit (INDEPENDENT_AMBULATORY_CARE_PROVIDER_SITE_OTHER): Payer: Medicare Other | Admitting: Gastroenterology

## 2023-08-23 VITALS — BP 118/60 | HR 80 | Ht 67.75 in | Wt 172.0 lb

## 2023-08-23 DIAGNOSIS — K52832 Lymphocytic colitis: Secondary | ICD-10-CM | POA: Diagnosis not present

## 2023-08-23 DIAGNOSIS — K625 Hemorrhage of anus and rectum: Secondary | ICD-10-CM | POA: Insufficient documentation

## 2023-08-23 LAB — COLOGUARD: COLOGUARD: NEGATIVE

## 2023-08-23 LAB — EXTERNAL GENERIC LAB PROCEDURE: COLOGUARD: NEGATIVE

## 2023-08-23 MED ORDER — HYDROCORTISONE ACETATE 25 MG RE SUPP: 25.0000 mg | Freq: Every evening | RECTAL | 1 refills | Status: AC

## 2023-08-23 MED ORDER — BUDESONIDE 3 MG PO CPEP: 9.0000 mg | ORAL_CAPSULE | Freq: Every day | ORAL | 2 refills | Status: DC

## 2023-08-23 NOTE — Progress Notes (Signed)
Noted  

## 2023-08-23 NOTE — Progress Notes (Signed)
08/23/2023 [name] [name] [id number] 01-27-1946   HISTORY OF PRESENT ILLNESS: This is a 77 year old female who is a patient of Dr. Lamar Sprinkles.  She met him once to establish care in February 2023.  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  Last colonoscopy was in December 2018 with random biopsies of the colon that revealed changes consistent with lymphocytic colitis.  She was treated with budesonide and that worked well.  She has not been on budesonide for quite some time.  She says over the past 2 or 3 months, however, she has been having issues with diarrhea.  Every time she eats she has a bowel movement.  No nocturnal bowel movements.  She has had 1 episode of blood in her stool recently that she believes it was from internal hemorrhoids.  She tells me that she did his Cologuard last week that was ordered by her PCP, not resulted yet.  Colonoscopy 10/20/2017. Diverticulosis. Otherwise normal. BIOPSIES SHOW LYMPHOCYTIC COLITIS EGD 10/20/2017. Mild inflammation at GEJ. Otherwise normal. Biopsies GEJ with mild inflammation. ENTEROSCOPY with FECAL TRANSPLANT. 01/11/2018   Past Medical History:  Diagnosis Date   Arthritis    Asthma    Atrial fibrillation (HCC)    C. difficile diarrhea    COPD (chronic obstructive pulmonary disease) (HCC)    High cholesterol    Hyperlipemia    IBS (irritable bowel syndrome)    Lymphocytic colitis    Renal disorder    Thyroid disease    Past Surgical History:  Procedure Laterality Date   CARPOMETACARPEL SUSPENSION PLASTY Left 05/25/2023   Procedure: THUMB TRAPEZIECTOMY WITH FIBERLOCK SUSPENSION PLASTY;  Surgeon: Marlyne Beards, MD;  Location: [facility];  Service: Orthopedics;  Laterality: Left;   ROTATOR CUFF REPAIR Right 2014   TONSILLECTOMY      reports that she quit smoking about 60 years ago. Her smoking use included cigarettes. She started  smoking about 61 years ago. She has never used smokeless tobacco. She reports current alcohol use. She reports that she does not use drugs. family history includes Emphysema in her mother; Heart disease in her father; Lupus in her son; Rheum arthritis in her maternal grandmother and son; Sjogren's syndrome in her son. Allergies  Allergen Reactions   Shellfish Allergy Swelling   Prednisone Other (See Comments)    Face turns red   Sulfonamide Derivatives Nausea Only      Outpatient Encounter Medications as of 08/23/2023  Medication Sig   acetaminophen (TYLENOL) 325 MG tablet Take 1-3 tablets by mouth as needed.   albuterol (PROVENTIL) (2.5 MG/3ML) 0.083% nebulizer solution Take 3 mLs (2.5 mg total) by nebulization every 6 (six) hours as needed for wheezing or shortness of breath.   alendronate (FOSAMAX) 70 MG tablet Take 70 mg by mouth once a week.   aspirin (ASPIRIN LOW DOSE) 81 MG EC tablet Take 1 tablet (81 mg total) by mouth 5 (five) times daily. Patient needs appointment for further refills. 1 st attempt   B Complex-C (SUPER B COMPLEX PO) Take 1 tablet by mouth daily.   Cholecalciferol (VITAMIN D3) 10 MCG (400 UNIT) tablet Take 1 tablet by mouth daily.   fluticasone (FLONASE) 50 MCG/ACT nasal spray SPRAY 2 SPRAYS INTO EACH NOSTRIL EVERY DAY   levothyroxine (SYNTHROID) 88 MCG tablet Take 88 mcg by mouth daily.   Melatonin 5 MG CAPS Take 1 tablet by mouth at bedtime.   montelukast (SINGULAIR) 10  MG tablet TAKE 1 TABLET BY MOUTH EVERYDAY AT BEDTIME   nitroGLYCERIN (NITROSTAT) 0.4 MG SL tablet For chest pain, tightness, or pressure. While sitting, place 1 tablet under tongue. May be used every 5 minutes as needed, for up to 15 minutes. Do not use more than 3 tablets.   rosuvastatin (CRESTOR) 5 MG tablet Take 1 tablet (5 mg total) by mouth daily.   sertraline (ZOLOFT) 50 MG tablet Take 50 mg by mouth daily.    Ascorbic Acid (VITAMIN C PO) Take 1 tablet by mouth daily. (Patient not taking:  Reported on 08/23/2023)   Budeson-Glycopyrrol-Formoterol (BREZTRI AEROSPHERE) 160-9-4.8 MCG/ACT AERO Inhale 2 puffs into the lungs in the morning and at bedtime. (Patient not taking: Reported on 08/23/2023)   budesonide-formoterol (SYMBICORT) 80-4.5 MCG/ACT inhaler Inhale 2 puffs into the lungs 2 (two) times daily. (Patient not taking: Reported on 08/23/2023)   famotidine (PEPCID) 20 MG tablet Take 20 mg by mouth at bedtime. (Patient not taking: Reported on 08/23/2023)   furosemide (LASIX) 20 MG tablet TAKE 1 TABLET BY MOUTH AS NEEDED   meclizine (ANTIVERT) 50 MG tablet Take 1 tablet (50 mg total) by mouth as needed. (Patient not taking: Reported on 08/23/2023)   Facility-Administered Encounter Medications as of 08/23/2023  Medication   albuterol (PROVENTIL) (2.5 MG/3ML) 0.083% nebulizer solution 2.5 mg     REVIEW OF SYSTEMS  : All other systems reviewed and negative except where noted in the History of Present Illness.   PHYSICAL EXAM: BP 118/60   Pulse 80   Ht 5' 7.75" (1.721 m)   Wt 172 lb (78 kg)   BMI 26.35 kg/m  General: Well developed white female in no acute distress Head: Normocephalic and atraumatic Eyes:  Sclerae anicteric, conjunctiva pink. Ears: Normal auditory acuity Lungs: Clear throughout to auscultation; no W/R/R. Heart: Regular rate and rhythm; no M/R/G. Abdomen: Soft, non-distended.  BS present.  Non-tender. Musculoskeletal: Symmetrical with no gross deformities  Skin: No lesions on visible extremities Extremities: No edema  Neurological: Alert oriented x 4, grossly non-focal Psychological:  Alert and cooperative. Normal mood and affect  ASSESSMENT AND PLAN: *77 year old female with history of current C. difficile requiring fecal transplant and microscopic/lymphocytic colitis: Has been off of treatment for microscopic colitis for years.  Started having a lot of diarrhea again the past few months.  I suggested a stool study to rule out C. difficile, but she is  adamant that she does not think it is C. difficile and declined to do this.  Will restart budesonide at 9 mg daily for 12 weeks then taper down to 6 mg daily for 8 weeks then 3 mg daily for 8 weeks and see how she does.  Prescription sent to pharmacy.  Certainly if symptoms do not resolve with the budesonide or worsen then need to consider stool study for C. difficile. *Rectal bleeding:  Possibly due to hemorrhoids.  Will treat with hydrocortisone suppositories at bedtime prn.  Prescription sent to pharmacy.    CC:  Daisy Floro, MD

## 2023-08-23 NOTE — Patient Instructions (Addendum)
We have sent the following medications to your pharmacy for you to pick up at your convenience: Budesonide 9 mg daily for 12 weeks, then decrease to 6 mg for 8 weeks, then 3 mg daily for 8 weeks.  Hydrocortisone suppository nightly as needed.   _______________________________________________________  If your blood pressure at your visit was 140/90 or greater, please contact your primary care physician to follow up on this.  _______________________________________________________  If you are age 79 or older, your body mass index should be between 23-30. Your Body mass index is 26.35 kg/m. If this is out of the aforementioned range listed, please consider follow up with your Primary Care Provider.  If you are age 2 or younger, your body mass index should be between 19-25. Your Body mass index is 26.35 kg/m. If this is out of the aformentioned range listed, please consider follow up with your Primary Care Provider.   ________________________________________________________  The [facility] providers would like to encourage you to use [facility] to communicate with providers for non-urgent requests or questions.  Due to long hold times on the telephone, sending your provider a message by [facility] may be a faster and more efficient way to get a response.  Please allow 48 business hours for a response.  Please remember that this is for non-urgent requests.  _______________________________________________________

## 2023-08-30 DIAGNOSIS — M180 Bilateral primary osteoarthritis of first carpometacarpal joints: Secondary | ICD-10-CM | POA: Diagnosis not present

## 2023-09-21 DIAGNOSIS — H6123 Impacted cerumen, bilateral: Secondary | ICD-10-CM | POA: Diagnosis not present

## 2023-09-21 DIAGNOSIS — H903 Sensorineural hearing loss, bilateral: Secondary | ICD-10-CM | POA: Diagnosis not present

## 2023-09-28 ENCOUNTER — Telehealth: Payer: Self-pay | Admitting: Gastroenterology

## 2023-09-28 NOTE — Telephone Encounter (Signed)
Inbound call from patient stating she is still having issues with bleeding hemorrhoids. Patient wanted to scheduled an appointment with Dr. Marina Goodell and I advised her his next appointment was in February. Patient stated she could not wait that long and requested to speak with nurse. Please advise.

## 2023-09-29 DIAGNOSIS — Z6826 Body mass index (BMI) 26.0-26.9, adult: Secondary | ICD-10-CM | POA: Diagnosis not present

## 2023-09-29 DIAGNOSIS — Z01419 Encounter for gynecological examination (general) (routine) without abnormal findings: Secondary | ICD-10-CM | POA: Diagnosis not present

## 2023-09-29 NOTE — Telephone Encounter (Signed)
Left message on machine to call back  

## 2023-09-30 NOTE — Telephone Encounter (Signed)
The pt states that she continues to have painful hemorrhoids.  She last saw Shanda Bumps on 10/29 and was prescribed  Anusol supp but she has not been using them as directed. I did advise that she use them nighlty for 7 nights as prescribed and keep her bowels soft.  She states that she has no issues with her bowels.  She has some BRB when having a BM.  She will do sitz baths as able. She has an appt with Dr Marina Goodell on 11/18/23. She will call back if things worsen before that appt.

## 2023-10-04 DIAGNOSIS — Z1231 Encounter for screening mammogram for malignant neoplasm of breast: Secondary | ICD-10-CM | POA: Diagnosis not present

## 2023-10-11 DIAGNOSIS — M180 Bilateral primary osteoarthritis of first carpometacarpal joints: Secondary | ICD-10-CM | POA: Diagnosis not present

## 2023-10-16 ENCOUNTER — Other Ambulatory Visit: Payer: Self-pay | Admitting: Gastroenterology

## 2023-11-18 ENCOUNTER — Encounter: Payer: Self-pay | Admitting: Internal Medicine

## 2023-11-18 ENCOUNTER — Ambulatory Visit: Payer: HMO | Admitting: Internal Medicine

## 2023-11-18 VITALS — BP 116/68 | HR 84 | Ht 68.0 in | Wt 178.0 lb

## 2023-11-18 DIAGNOSIS — K648 Other hemorrhoids: Secondary | ICD-10-CM | POA: Diagnosis not present

## 2023-11-18 DIAGNOSIS — K52832 Lymphocytic colitis: Secondary | ICD-10-CM

## 2023-11-18 DIAGNOSIS — K625 Hemorrhage of anus and rectum: Secondary | ICD-10-CM | POA: Diagnosis not present

## 2023-11-18 MED ORDER — HYDROCORTISONE (PERIANAL) 2.5 % EX CREA: 1.0000 | TOPICAL_CREAM | Freq: Two times a day (BID) | CUTANEOUS | 2 refills | Status: DC

## 2023-11-18 NOTE — Progress Notes (Unsigned)
Vomiting, pain

## 2023-11-18 NOTE — Patient Instructions (Addendum)
We have sent the following medications to your pharmacy for you to pick up at your convenience:  Anusol HC Cream.  Purchase Preparation H suppositories over the counter.  Put a pea sized amount of the hydrocortisone cream on a suppository and insert rectally every night at bedtime for 1 month.  _______________________________________________________  If your blood pressure at your visit was 140/90 or greater, please contact your primary care physician to follow up on this.  _______________________________________________________  If you are age 34 or older, your body mass index should be between 23-30. Your Body mass index is 27.06 kg/m. If this is out of the aforementioned range listed, please consider follow up with your Primary Care Provider.  If you are age 21 or younger, your body mass index should be between 19-25. Your Body mass index is 27.06 kg/m. If this is out of the aformentioned range listed, please consider follow up with your Primary Care Provider.   ________________________________________________________  The [facility] providers would like to encourage you to use [facility] to communicate with providers for non-urgent requests or questions.  Due to long hold times on the telephone, sending your provider a message by [facility] may be a faster and more efficient way to get a response.  Please allow 48 business hours for a response.  Please remember that this is for non-urgent requests.  _______________________________________________________

## 2023-11-21 ENCOUNTER — Encounter: Payer: Self-pay | Admitting: Internal Medicine

## 2023-12-23 DIAGNOSIS — M654 Radial styloid tenosynovitis [de Quervain]: Secondary | ICD-10-CM | POA: Insufficient documentation

## 2024-01-03 NOTE — Progress Notes (Signed)
 Cardiology Office Note:    Date:  01/10/2024   ID:  [name] [name], DOB [birth year], MRN [id number]  PCP:  Daisy Floro, MD   [facility] Providers Cardiologist:  Rollene Rotunda, MD Cardiology APP:  Marcelino Duster, [name]     Referring MD: Daisy Floro, MD   Chief Complaint  Patient presents with   Follow-up    CAD    History of Present Illness:    [name] [name] is a 78 y.o. female with a hx of CAD on coronary CTA with FFR negative disease in the LAD and LCX 2023, HTN, HLD, severe COPD, asthma with chronic cough, PAF not on anticoagulation, and CKD II. I do not have records of PAF.  Previously seen by Dr. Dulce Sellar and now followed by Dr. Antoine Poche.   Last seen 08/2022 by Dr. Antoine Poche and doing well.  She had an incident 2 weeks ago in which chest pain and jaw tightness woke her from sleep. She rolled to her other side and the pain subsided within one minutes. She was able to go back to sleep, changing positions seemed to relieve the CP and jaw pain. She did not take a NTG. No chest pain since. She is not sleeping on her left side anymore. She is sedentary, so unable to assess exertional symptoms. Although she is going on a cruise and knows she needs to walk. She also reports increased swelling in her calves at night and loss of feeling in her toes an burning in both feet. Swelling is worse at night, R > L.   She also reports an episode of syncope. She woke up and was dizzy = she got up to get a meclizine and zofran and then fell. She thinks she momentarily passed out but says she wasn't completely unconscious, this was followed by emesis and diarrhea. With further discussion does not sound like true syncope. She called her daughter across the street.   Past Medical History:  Diagnosis Date   Arthritis    Asthma    Atrial fibrillation (HCC)    C. difficile diarrhea    COPD (chronic obstructive pulmonary disease) (HCC)    High cholesterol    Hyperlipemia     IBS (irritable bowel syndrome)    Lymphocytic colitis    Renal disorder    Thyroid disease     Past Surgical History:  Procedure Laterality Date   CARPOMETACARPEL SUSPENSION PLASTY Left 05/25/2023   Procedure: THUMB TRAPEZIECTOMY WITH FIBERLOCK SUSPENSION PLASTY;  Surgeon: Marlyne Beards, MD;  Location: [facility];  Service: Orthopedics;  Laterality: Left;   ROTATOR CUFF REPAIR Right 2014   TONSILLECTOMY      Current Medications: Current Meds  Medication Sig   acetaminophen (TYLENOL) 325 MG tablet Take 1-3 tablets by mouth as needed.   albuterol (PROVENTIL) (2.5 MG/3ML) 0.083% nebulizer solution Take 3 mLs (2.5 mg total) by nebulization every 6 (six) hours as needed for wheezing or shortness of breath.   alendronate (FOSAMAX) 70 MG tablet Take 70 mg by mouth once a week.   Ascorbic Acid (VITAMIN C PO) Take 1 tablet by mouth daily.   aspirin (ASPIRIN LOW DOSE) 81 MG EC tablet Take 1 tablet (81 mg total) by mouth 5 (five) times daily. Patient needs appointment for further refills. 1 st attempt (Patient taking differently: Take 81 mg by mouth daily.)   B Complex-C (SUPER B COMPLEX PO) Take 1 tablet by mouth daily.   budesonide (ENTOCORT EC) 3  MG 24 hr capsule TAKE 3 CAPSULES (9 MG TOTAL) BY MOUTH DAILY.   Cholecalciferol (VITAMIN D3) 10 MCG (400 UNIT) tablet Take 1 tablet by mouth daily.   famotidine (PEPCID) 20 MG tablet Take 20 mg by mouth at bedtime.   fluticasone (FLONASE) 50 MCG/ACT nasal spray SPRAY 2 SPRAYS INTO EACH NOSTRIL EVERY DAY   hydrocortisone (ANUSOL-HC) 2.5 % rectal cream Place 1 Application rectally 2 (two) times daily.   hydrocortisone (ANUSOL-HC) 25 MG suppository Place 1 suppository (25 mg total) rectally at bedtime.   levothyroxine (SYNTHROID) 88 MCG tablet Take 88 mcg by mouth daily.   meclizine (ANTIVERT) 50 MG tablet Take 1 tablet (50 mg total) by mouth as needed.   Melatonin 5 MG CAPS Take 1 tablet by mouth at bedtime.   montelukast  (SINGULAIR) 10 MG tablet TAKE 1 TABLET BY MOUTH EVERYDAY AT BEDTIME   rosuvastatin (CRESTOR) 5 MG tablet Take 1 tablet (5 mg total) by mouth daily.   sertraline (ZOLOFT) 50 MG tablet Take 50 mg by mouth daily.    [DISCONTINUED] furosemide (LASIX) 20 MG tablet TAKE 1 TABLET BY MOUTH AS NEEDED (Patient taking differently: Take 20 mg by mouth once a week. 20 mg daily for 2 days, then take Lasix 20 mg once weekly)   [DISCONTINUED] nitroGLYCERIN (NITROSTAT) 0.4 MG SL tablet For chest pain, tightness, or pressure. While sitting, place 1 tablet under tongue. May be used every 5 minutes as needed, for up to 15 minutes. Do not use more than 3 tablets.   Current Facility-Administered Medications for the 01/10/24 encounter (Office Visit) with Marcelino Duster, PA  Medication   albuterol (PROVENTIL) (2.5 MG/3ML) 0.083% nebulizer solution 2.5 mg     Allergies:   Shellfish allergy, Prednisone, and Sulfonamide derivatives   Social History   Socioeconomic History   Marital status: Divorced    Spouse name: Not on file   Number of children: 2   Years of education: Not on file   Highest education level: Not on file  Occupational History   Occupation: retired  Tobacco Use   Smoking status: Former    Current packs/day: 0.00    Types: Cigarettes    Start date: 10/1961    Quit date: 09/1962    Years since quitting: 61.3   Smokeless tobacco: Never   Tobacco comments:    passive cigarette smoke exposure, smoked in high school  Vaping Use   Vaping status: Never Used  Substance and Sexual Activity   Alcohol use: Yes    Comment: 3 times per week   Drug use: No   Sexual activity: Not on file  Other Topics Concern   Not on file  Social History Narrative   Not on file   Social Drivers of Health   Financial Resource Strain: Not on file  Food Insecurity: Not on file  Transportation Needs: Not on file  Physical Activity: Not on file  Stress: Not on file  Social Connections: Unknown (01/12/2020)    [facility]   Social Connections    Frequency of Communication with Friends and Family: Not asked    Frequency of Social Gatherings with Friends and Family: Not asked     Family History: The patient's family history includes Emphysema in her mother; Heart disease in her father; Lupus in her son; Rheum arthritis in her maternal grandmother and son; Sjogren's syndrome in her son.  ROS:   Please see the history of present illness.  All other systems reviewed and are negative.  EKGs/Labs/Other Studies Reviewed:    The following studies were reviewed today:  EKG Interpretation Date/Time:  Tuesday January 10 2024 10:13:26 EDT Ventricular Rate:  74 PR Interval:  138 QRS Duration:  82 QT Interval:  414 QTC Calculation: 459 R Axis:   5  Text Interpretation: Normal sinus rhythm Nonspecific ST abnormality When compared with ECG of 17-May-2023 10:55, T wave inversion no longer evident in Anterior leads Confirmed by Micah Flesher ([id number]) on 01/10/2024 10:17:58 AM    Recent Labs: 05/17/2023: BUN 14; Creatinine, Ser 1.14; Potassium 4.5; Sodium 139  Recent Lipid Panel    Component Value Date/Time   CHOL 148 03/15/2022 0932   TRIG 122 03/15/2022 0932   HDL 55 03/15/2022 0932   CHOLHDL 2.7 03/15/2022 0932   LDLCALC 71 03/15/2022 0932     Risk Assessment/Calculations:                Physical Exam:    VS:  BP (!) 120/56   Pulse 74   Ht 5\' 8"  (1.727 m)   Wt 177 lb (80.3 kg)   SpO2 97%   BMI 26.91 kg/m     Wt Readings from Last 3 Encounters:  01/10/24 177 lb (80.3 kg)  01/05/24 176 lb (79.8 kg)  11/18/23 178 lb (80.7 kg)     GEN:  Well nourished, well developed in no acute distress HEENT: Normal NECK: No JVD; No carotid bruits LYMPHATICS: No lymphadenopathy CARDIAC: RRR, no murmurs, rubs, gallops RESPIRATORY:  Clear to auscultation without rales, wheezing or rhonchi  ABDOMEN: Soft, non-tender, non-distended MUSCULOSKELETAL:  No edema; No  deformity  SKIN: Warm and dry NEUROLOGIC:  Alert and oriented x 3 PSYCHIATRIC:  Normal affect   ASSESSMENT:    1. Coronary artery disease involving native coronary artery of native heart without angina pectoris   2. Hyperlipidemia with target LDL less than 70   3. Chronic obstructive pulmonary disease, unspecified COPD type (HCC)   4. Chronic shortness of breath   5. Fall, initial encounter   6. Bilateral lower extremity edema    PLAN:    In order of problems listed above:  Nonobstructive CAD - on CT coronary, FFR negative in the LAD and LCX - on ASA monotherapy - 10 mg crestor   Hyperlipidemia with LDL goal < 70 Crestor increased to 10 mg in 2023 LDL was 81 07/2023   Severe COPD, asthma SOB - likely related to underlying lung disease   Fall - sounds like she had GI issues that contributed to dizziness, no LOC - will hold off on heart monitor   LE swelling She is concerned about calf swelling - I do not appreciate significatn swelling - low suspicion for DVT - possible venous insuffiency - will trial 20 mg lasix for 2 days then once weekly - she is also having symptoms of neuropathy    Follow up in 6 months after her cruise.            Medication Adjustments/Labs and Tests Ordered: Current medicines are reviewed at length with the patient today.  Concerns regarding medicines are outlined above.  Orders Placed This Encounter  Procedures   EKG 12-Lead   Meds ordered this encounter  Medications   furosemide (LASIX) 20 MG tablet    Sig: 20 mg daily for 2 days, then take Lasix 20 mg once weekly    Dispense:  30 tablet    Refill:  3   nitroGLYCERIN (NITROSTAT)  0.4 MG SL tablet    Sig: For chest pain, tightness, or pressure. While sitting, place 1 tablet under tongue. May be used every 5 minutes as needed, for up to 15 minutes. Do not use more than 3 tablets.    Dispense:  25 tablet    Refill:  3    Patient Instructions  Medication Instructions:   Lasix 20 mg daily for 2 days, then take Lasix 20 mg once weekly Stop Asprin  *If you need a refill on your cardiac medications before your next appointment, please call your pharmacy*   Lab Work: NONE ordered at this time of appointment    Testing/Procedures: NONE ordered at this time of appointment     Follow-Up: At [facility], you and your health needs are our priority.  As part of our continuing mission to provide you with exceptional heart care, we have created designated Provider Care Teams.  These Care Teams include your primary Cardiologist (physician) and Advanced Practice Providers (APPs -  Physician Assistants and Nurse Practitioners) who all work together to provide you with the care you need, when you need it.  We recommend signing up for the patient portal called "MyChart".  Sign up information is provided on this After Visit Summary.  MyChart is used to connect with patients for Virtual Visits (Telemedicine).  Patients are able to view lab/test results, encounter notes, upcoming appointments, etc.  Non-urgent messages can be sent to your provider as well.   To learn more about what you can do with MyChart, go to [email or web address].    Your next appointment:   6 month(s)  Provider:   Rollene Rotunda, MD     Other Instructions    Signed, Marcelino Duster, [name]  01/10/2024 10:55 AM    [facility]

## 2024-01-05 ENCOUNTER — Encounter: Payer: Self-pay | Admitting: Internal Medicine

## 2024-01-05 ENCOUNTER — Ambulatory Visit: Payer: HMO | Admitting: Internal Medicine

## 2024-01-05 VITALS — BP 122/70 | HR 70 | Ht 68.0 in | Wt 176.0 lb

## 2024-01-05 DIAGNOSIS — K625 Hemorrhage of anus and rectum: Secondary | ICD-10-CM

## 2024-01-05 DIAGNOSIS — Z8719 Personal history of other diseases of the digestive system: Secondary | ICD-10-CM

## 2024-01-05 DIAGNOSIS — K52832 Lymphocytic colitis: Secondary | ICD-10-CM

## 2024-01-05 DIAGNOSIS — Z8619 Personal history of other infectious and parasitic diseases: Secondary | ICD-10-CM

## 2024-01-05 DIAGNOSIS — K648 Other hemorrhoids: Secondary | ICD-10-CM | POA: Diagnosis not present

## 2024-01-05 NOTE — Patient Instructions (Signed)
 Use the Budesonide, hydrocortisone cream, and witch hazel as discussed with Dr. Marina Goodell.   _______________________________________________________  If your blood pressure at your visit was 140/90 or greater, please contact your primary care physician to follow up on this.  _______________________________________________________  If you are age 80 or older, your body mass index should be between 23-30. Your Body mass index is 26.76 kg/m. If this is out of the aforementioned range listed, please consider follow up with your Primary Care Provider.  If you are age 78 or younger, your body mass index should be between 19-25. Your Body mass index is 26.76 kg/m. If this is out of the aformentioned range listed, please consider follow up with your Primary Care Provider.   ________________________________________________________  The [place] [facility] providers would like to encourage you to use [facility] to communicate with providers for non-urgent requests or questions.  Due to long hold times on the telephone, sending your provider a message by [facility] may be a faster and more efficient way to get a response.  Please allow 48 business hours for a response.  Please remember that this is for non-urgent requests.  _______________________________________________________

## 2024-01-05 NOTE — Progress Notes (Signed)
 HISTORY OF PRESENT ILLNESS:  [name] [name] is a 78 y.o. 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  See education.  At that time she declined rectal exam or anoscopy.  She was prescribed Anusol and Citrucel.  Follow-up at this time recommended.  Patient tells me that she has been using budesonide 3 mg daily.  This has controlled the diarrhea.  She is using Citrucel.  She continues to have some issues with what she believes are hemorrhoids with swelling sensation and occasional bleeding.  She does not feel that the Anusol suppositories were helpful.  She wants to use witch hazel.  She is now agreeable to rectal examination to exclude other causes for her symptoms.  Last colonoscopy 2018.  REVIEW OF SYSTEMS:  All non-GI ROS negative except for  Past Medical History:  Diagnosis Date   Arthritis    Asthma    Atrial fibrillation (HCC)    C. difficile diarrhea    COPD (chronic obstructive pulmonary disease) (HCC)    High cholesterol    Hyperlipemia    IBS (irritable bowel syndrome)    Lymphocytic colitis    Renal disorder    Thyroid disease     Past Surgical History:  Procedure Laterality Date   CARPOMETACARPEL SUSPENSION PLASTY Left 05/25/2023   Procedure: THUMB TRAPEZIECTOMY WITH FIBERLOCK SUSPENSION PLASTY;  Surgeon: Marlyne Beards, MD;  Location: [facility];  Service: Orthopedics;  Laterality: Left;   ROTATOR CUFF REPAIR Right 2014   TONSILLECTOMY      Social History [name] [name]  reports that she quit smoking about 61 years ago. Her smoking use included cigarettes. She started smoking about 62 years ago. She has never used smokeless tobacco. She reports  current alcohol use. She reports that she does not use drugs.  family history includes Emphysema in her mother; Heart disease in her father; Lupus in her son; Rheum arthritis in her maternal grandmother and son; Sjogren's syndrome in her son.  Allergies  Allergen Reactions   Shellfish Allergy Swelling   Prednisone Other (See Comments)    Face turns red   Sulfonamide Derivatives Nausea Only       PHYSICAL EXAMINATION: Vital signs: BP 122/70   Pulse 70   Ht 5\' 8"  (1.727 m)   Wt 176 lb (79.8 kg)   BMI 26.76 kg/m   Constitutional: generally well-appearing, no acute distress Psychiatric: alert and oriented x3, cooperative Eyes: extraocular movements intact, anicteric, conjunctiva pink Mouth: oral pharynx moist, no lesions Neck: supple no lymphadenopathy Cardiovascular: heart regular rate and rhythm, no murmur Lungs: clear to auscultation bilaterally Abdomen: soft, nontender, nondistended, no obvious ascites, no peritoneal signs, normal bowel sounds, no organomegaly Rectal: External tag.  Moderate swollen internal hemorrhoids with slight friability.  No mass or tenderness.  Brown stool Extremities: no clubbing, cyanosis, or lower extremity edema bilaterally Skin: no lesions on visible extremities Neuro: No focal deficits.   ASSESSMENT:  1.  Internal hemorrhoids with bleeding 2.  History of lymphocytic colitis. 3.  History of C. difficile colitis requiring fecal transplant.   PLAN:  1.  Continue fiber 2.  Continue Anusol 3.  The patient wants to try witch hazel 4.  She has a brochure regarding hemorrhoidal banding.  I think she  would be an excellent candidate. 5.  Okay to use budesonide on demand for issues with diarrhea 6.  Routine office GI follow-up 1 year.  Sooner if needed

## 2024-01-10 ENCOUNTER — Ambulatory Visit: Attending: Physician Assistant | Admitting: Physician Assistant

## 2024-01-10 ENCOUNTER — Encounter: Payer: Self-pay | Admitting: Physician Assistant

## 2024-01-10 VITALS — BP 120/56 | HR 74 | Ht 68.0 in | Wt 177.0 lb

## 2024-01-10 DIAGNOSIS — R0602 Shortness of breath: Secondary | ICD-10-CM | POA: Diagnosis not present

## 2024-01-10 DIAGNOSIS — W19XXXA Unspecified fall, initial encounter: Secondary | ICD-10-CM | POA: Diagnosis not present

## 2024-01-10 DIAGNOSIS — J449 Chronic obstructive pulmonary disease, unspecified: Secondary | ICD-10-CM

## 2024-01-10 DIAGNOSIS — E785 Hyperlipidemia, unspecified: Secondary | ICD-10-CM

## 2024-01-10 DIAGNOSIS — I2510 Atherosclerotic heart disease of native coronary artery without angina pectoris: Secondary | ICD-10-CM | POA: Diagnosis not present

## 2024-01-10 DIAGNOSIS — R600 Localized edema: Secondary | ICD-10-CM

## 2024-01-10 MED ORDER — NITROGLYCERIN 0.4 MG SL SUBL: SUBLINGUAL_TABLET | SUBLINGUAL | 3 refills | Status: AC

## 2024-01-10 MED ORDER — FUROSEMIDE 20 MG PO TABS: ORAL_TABLET | ORAL | 3 refills | Status: AC

## 2024-01-10 NOTE — Patient Instructions (Addendum)
 Medication Instructions:  Lasix 20 mg daily for 2 days, then take Lasix 20 mg once weekly Stop Asprin  *If you need a refill on your cardiac medications before your next appointment, please call your pharmacy*   Lab Work: NONE ordered at this time of appointment    Testing/Procedures: NONE ordered at this time of appointment     Follow-Up: At [facility], you and your health needs are our priority.  As part of our continuing mission to provide you with exceptional heart care, we have created designated Provider Care Teams.  These Care Teams include your primary Cardiologist (physician) and Advanced Practice Providers (APPs -  Physician Assistants and Nurse Practitioners) who all work together to provide you with the care you need, when you need it.  We recommend signing up for the patient portal called "MyChart".  Sign up information is provided on this After Visit Summary.  MyChart is used to connect with patients for Virtual Visits (Telemedicine).  Patients are able to view lab/test results, encounter notes, upcoming appointments, etc.  Non-urgent messages can be sent to your provider as well.   To learn more about what you can do with MyChart, go to [email or web address].    Your next appointment:   6 month(s)  Provider:   Rollene Rotunda, MD     Other Instructions   1st Floor: - Lobby - Registration  - Pharmacy  - Lab - Cafe  2nd Floor: - PV Lab - Diagnostic Testing (echo, CT, nuclear med)  3rd Floor: - Vacant  4th Floor: - TCTS (cardiothoracic surgery) - AFib Clinic - Structural Heart Clinic - Vascular Surgery  - Vascular Ultrasound  5th Floor: - HeartCare Cardiology (general and EP) - Clinical Pharmacy for coumadin, hypertension, lipid, weight-loss medications, and med management appointments    Valet parking services will be available as well.

## 2024-02-02 DIAGNOSIS — I480 Paroxysmal atrial fibrillation: Secondary | ICD-10-CM | POA: Diagnosis not present

## 2024-02-02 DIAGNOSIS — K5289 Other specified noninfective gastroenteritis and colitis: Secondary | ICD-10-CM | POA: Diagnosis not present

## 2024-02-02 DIAGNOSIS — N183 Chronic kidney disease, stage 3 unspecified: Secondary | ICD-10-CM | POA: Diagnosis not present

## 2024-02-02 DIAGNOSIS — E039 Hypothyroidism, unspecified: Secondary | ICD-10-CM | POA: Diagnosis not present

## 2024-02-02 DIAGNOSIS — E782 Mixed hyperlipidemia: Secondary | ICD-10-CM | POA: Diagnosis not present

## 2024-02-09 DIAGNOSIS — J449 Chronic obstructive pulmonary disease, unspecified: Secondary | ICD-10-CM | POA: Diagnosis not present

## 2024-02-09 DIAGNOSIS — R062 Wheezing: Secondary | ICD-10-CM | POA: Diagnosis not present

## 2024-02-09 DIAGNOSIS — Z6827 Body mass index (BMI) 27.0-27.9, adult: Secondary | ICD-10-CM | POA: Diagnosis not present

## 2024-02-09 DIAGNOSIS — N1832 Chronic kidney disease, stage 3b: Secondary | ICD-10-CM | POA: Diagnosis not present

## 2024-02-13 ENCOUNTER — Ambulatory Visit (INDEPENDENT_AMBULATORY_CARE_PROVIDER_SITE_OTHER)

## 2024-02-13 ENCOUNTER — Ambulatory Visit

## 2024-02-13 ENCOUNTER — Ambulatory Visit: Admitting: Nurse Practitioner

## 2024-02-13 ENCOUNTER — Encounter: Payer: Self-pay | Admitting: Nurse Practitioner

## 2024-02-13 ENCOUNTER — Ambulatory Visit: Payer: Self-pay | Admitting: Pulmonary Disease

## 2024-02-13 VITALS — BP 130/76 | HR 104 | Temp 98.0°F | Ht 68.0 in | Wt 177.8 lb

## 2024-02-13 DIAGNOSIS — J4489 Other specified chronic obstructive pulmonary disease: Secondary | ICD-10-CM | POA: Diagnosis not present

## 2024-02-13 DIAGNOSIS — J4541 Moderate persistent asthma with (acute) exacerbation: Secondary | ICD-10-CM

## 2024-02-13 DIAGNOSIS — J069 Acute upper respiratory infection, unspecified: Secondary | ICD-10-CM | POA: Diagnosis not present

## 2024-02-13 DIAGNOSIS — J4531 Mild persistent asthma with (acute) exacerbation: Secondary | ICD-10-CM | POA: Diagnosis not present

## 2024-02-13 DIAGNOSIS — J441 Chronic obstructive pulmonary disease with (acute) exacerbation: Secondary | ICD-10-CM

## 2024-02-13 DIAGNOSIS — I700 Atherosclerosis of aorta: Secondary | ICD-10-CM | POA: Diagnosis not present

## 2024-02-13 DIAGNOSIS — J45901 Unspecified asthma with (acute) exacerbation: Secondary | ICD-10-CM

## 2024-02-13 DIAGNOSIS — R058 Other specified cough: Secondary | ICD-10-CM

## 2024-02-13 DIAGNOSIS — R062 Wheezing: Secondary | ICD-10-CM | POA: Diagnosis not present

## 2024-02-13 LAB — POCT EXHALED NITRIC OXIDE: FeNO level (ppb): 38

## 2024-02-13 MED ORDER — IPRATROPIUM-ALBUTEROL 0.5-2.5 (3) MG/3ML IN SOLN
3.0000 mL | Freq: Once | RESPIRATORY_TRACT | Status: AC
Start: 2024-02-13 — End: 2024-02-13
  Administered 2024-02-13: 3 mL via RESPIRATORY_TRACT

## 2024-02-13 MED ORDER — BUDESONIDE-FORMOTEROL FUMARATE 80-4.5 MCG/ACT IN AERO
2.0000 | INHALATION_SPRAY | Freq: Two times a day (BID) | RESPIRATORY_TRACT | 12 refills | Status: DC
Start: 2024-02-13 — End: 2024-02-22

## 2024-02-13 MED ORDER — PREDNISONE 10 MG PO TABS
ORAL_TABLET | ORAL | 0 refills | Status: AC
Start: 2024-02-13 — End: ?

## 2024-02-13 MED ORDER — BENZONATATE 200 MG PO CAPS
200.0000 mg | ORAL_CAPSULE | Freq: Three times a day (TID) | ORAL | 1 refills | Status: AC | PRN
Start: 2024-02-13 — End: ?

## 2024-02-13 MED ORDER — PROMETHAZINE-DM 6.25-15 MG/5ML PO SYRP
5.0000 mL | ORAL_SOLUTION | Freq: Four times a day (QID) | ORAL | 0 refills | Status: AC | PRN
Start: 2024-02-13 — End: ?

## 2024-02-13 MED ORDER — METHYLPREDNISOLONE ACETATE 80 MG/ML IJ SUSP
80.0000 mg | Freq: Once | INTRAMUSCULAR | Status: AC
Start: 2024-02-13 — End: 2024-02-13
  Administered 2024-02-13: 80 mg via INTRAMUSCULAR

## 2024-02-13 MED ORDER — ALBUTEROL SULFATE (2.5 MG/3ML) 0.083% IN NEBU: 2.5000 mg | INHALATION_SOLUTION | Freq: Four times a day (QID) | RESPIRATORY_TRACT | 3 refills | Status: AC | PRN

## 2024-02-13 NOTE — Telephone Encounter (Signed)
 E2C2 Pulmonary Triage - Initial Assessment Questions "Chief Complaint (e.g., cough, sob, wheezing, fever, chills, sweat or additional symptoms) *Go to specific symptom protocol after initial questions. Patient with hx of COPD calling reports generalized not feeling well this past Saturday-patient reports wheezing and coughing. Coughing and wheezing can be heard while speaking to patient. Patient reports using her inhaler without much improvement. Patient reports having albuterol  but states medication is six months out of date. Patient states she feels like symptoms are similar to the start of a cold but patient is concerned about the start of PNA. Patient reports coughing up mucus at times-patient states mucus is "halfway yellowish." Patient unsure if she had a fever but endorses sweating this morning. Patient is unable to say what her pulse oximeter readings are. Patient isn't on supplemental oxygen. Patient is requesting to be seen by pulmonary. Appointment made to be seen acutely 02/13/2024 at 2:00 PM. Patient verbalized understanding of the plan and all questions answered.   "How long have symptoms been present?" Since Saturday  Have you tested for COVID or Flu? Note: If not, ask patient if a home test can be taken. If so, instruct patient to call back for positive results. No  MEDICINES:   "Have you used any OTC meds to help with symptoms?" Yes If yes, ask "What medications?" Mucinex  DM Cough syrup Antihistamine Vitamin C  "Have you used your inhalers/maintenance medication?" Yes If yes, "What medications?" Symbicort  80-4.5 mcg  If inhaler, ask "How many puffs and how often?" Note: Review instructions on medication in the chart. Symbicort  2 puffs daily.   OXYGEN: "Do you wear supplemental oxygen?" No If yes, "How many liters are you supposed to use?"   "Do you monitor your oxygen levels?" No If yes, "What is your reading (oxygen level) today?"   "What is your usual oxygen  saturation reading?"  (Note: Pulmonary O2 sats should be 90% or greater) Patient reports not monitoring her oxygen levels.    Copied from CRM [id number]. Topic: Clinical - Red Word Triage >> Feb 13, 2024  8:22 AM [name] [name] wrote: [facility] that prompted transfer to Nurse Triage: sweaty cpughing headache think she has a fever and wants to get in to see someone and wheezing real bad Reason for Disposition  Wheezing is present  Answer Assessment - Initial Assessment Questions 1. ONSET: "When did the cough begin?"      Cough started on Saturday 2. SEVERITY: "How bad is the cough today?"      9 out of 10 3. SPUTUM: "Describe the color of your sputum" (none, dry cough; clear, white, yellow, green)     Sometimes-states sputum is "halfway yellowish" 4. HEMOPTYSIS: "Are you coughing up any blood?" If so ask: "How much?" (flecks, streaks, tablespoons, etc.)     No 5. DIFFICULTY BREATHING: "Are you having difficulty breathing?" If Yes, ask: "How bad is it?" (e.g., mild, moderate, severe)    - MILD: No SOB at rest, mild SOB with walking, speaks normally in sentences, can lie down, no retractions, pulse < 100.    - MODERATE: SOB at rest, SOB with minimal exertion and prefers to sit, cannot lie down flat, speaks in phrases, mild retractions, audible wheezing, pulse 100-120.    - SEVERE: Very SOB at rest, speaks in single words, struggling to breathe, sitting hunched forward, retractions, pulse > 120      Moderate 6. FEVER: "Do you have a fever?" If Yes, ask: "What is your temperature, how was it measured,  and when did it start?"     Patient unsure but endorses that she was sweating this morning 7. CARDIAC HISTORY: "Do you have any history of heart disease?" (e.g., heart attack, congestive heart failure)      CHF 8. LUNG HISTORY: "Do you have any history of lung disease?"  (e.g., pulmonary embolus, asthma, emphysema)     COPD 9. PE RISK FACTORS: "Do you have a history of blood clots?" (or: recent major  surgery, recent prolonged travel, bedridden)     no  Protocols used: Cough - Acute Productive-A-AH

## 2024-02-13 NOTE — Patient Instructions (Addendum)
 Continue budesonide /formoterol  2 puffs Twice daily. Brush tongue and rinse mouth afterward Continue Albuterol  inhaler 2 puffs or 3 mL neb every 6 hours as needed for shortness of breath or wheezing. Notify if symptoms persist despite rescue inhaler/neb use. Restart flonase  nasal spray 2 sprays each nostril daily Continue cetirizine 1 tab daily for allergies Continue chlorpheniramine  over the counter as needed for cough/drainage  Continue montelukast  (singulair ) 1 tab At bedtime  Prednisone  taper. 4 tabs for 2 days, then 3 tabs for 2 days, 2 tabs for 2 days, then 1 tab for 2 days, then stop. Take in AM with food. Start tomorrow Promethazine  DM cough syrup 5 mL every 6 hours as needed for cough. Do not drive after taking. May cause drowsiness Benzonatate  1 capsule Three times a day as needed for cough   Chest x ray today   Steroid shot today  Follow up in 2-4 weeks with Dr. Washington Hacker or Katie Hennessy Bartel,NP. If symptoms do not improve or worsen, please contact office for sooner follow up or seek emergency care.

## 2024-02-13 NOTE — Progress Notes (Signed)
 @Patient  ID: [name] [name], female    DOB: 09/13/1946, 78 y.o.   MRN: [id number]  Chief Complaint  Patient presents with   Follow-up    Coughing, wheezing, headache and fatigued. Started last Saturday, has gotten worse.    Referring provider: Jimmey Mould, MD  HPI: 78 year old female, former remote smoker followed for chronic cough and asthma. She is a patient of Dr. Jacqualyn Mates and last seen in office 12/13/2022. Past medical history significant for CAD, PAF, colitis, hypothyroid, osteopenia, arthritis, CKD, HLD, vertigo.  TEST/EVENTS:  09/21/2010 PFTs: FVC 3.25 (93%), FEV1 2.3 (90%), ratio 68, TLC 92%, DLCO 101%. Mild obstructive defect present 12/21/2021 CXR 2 view: Lungs hyperinflated without any evidence of acute process/superimposed infection.  11/2021 FeNO 64 ppb  12/13/2022: Ov with Dr. Washington Hacker. Seen January for subacute/chronic cough. Advised to take Symbicort  more consistently. Recommended to take cetrizine, fluticasone  and pepcid. Rx tussionex. Unable to tell if Symbicort  is working now. Still having dry cough and occasional wheezing. Using tussionex cough syrup PRN. Compliant with pepcid. Using nasal sprays. Prior spiriva use with hoarseness. Trial step up with Breztri .   02/13/2024: Today - acute  Discussed the use of AI scribe software for clinical note transcription with the patient, who gave verbal consent to proceed.  History of Present Illness   [name] [name] is an 78 year old female with asthma/COPD who presents with wheezing, sore throat, and cough.  She has been experiencing wheezing for a couple of weeks, initially mild but worsening over time. On Saturday morning, she developed a sore throat and a persistent cough. She's also experiencing headaches, which she attributes to the coughing. She suspects she may have had a fever this morning as she felt sweaty, but her temperature was normal when checked later. She did take 650 mg of tylenol  at around 9 am.    She is currently using budesonide /formoterol  (Symbicort ) inhaler, two puffs twice a day. Breztri  was too expensive and didn't notice a difference. She also takes cetirizine, chlorpheniramine , and singulair . For symptom relief, she's been using mucinex  DM but it's not helping control her cough, which is paroxysmal at times. She has a nebulizer at home but lacks the medication for it. She is not currently taking oral budesonide .   Her cough is sometimes productive, with clear to slightly yellow sputum. No significant sinus congestion or facial pain, but she notes some sinus drainage and swollen glands in her neck. She feels short of breath and wheezy.  She feels generally unwell and low on energy. She has not completed any viral testing.  FeNO 38 ppb       Allergies  Allergen Reactions   Shellfish Allergy  Swelling   Sulfonamide Derivatives Nausea Only    Immunization History  Administered Date(s) Administered   Fluad Quad(high Dose 65+) 09/02/2021   Influenza Split 08/26/2011, 08/03/2013   Influenza Whole 09/11/2010   Influenza, High Dose Seasonal PF 07/11/2012, 08/19/2014, 08/06/2015, 08/22/2018, 07/25/2019   Influenza,inj,quad, With Preservative 09/24/2020   PFIZER(Purple Top)SARS-COV-2 Vaccination 11/16/2019, 12/07/2019, 07/25/2020, 02/09/2021, 05/19/2022   Pneumococcal Conjugate-13 08/08/2014   Pneumococcal Polysaccharide-23 05/11/2010, 08/13/2012, 05/19/2022   Respiratory Syncytial Virus Vaccine,Recomb Aduvanted(Arexvy) 10/20/2022, 10/21/2022   Td 04/11/2022   Zoster, Live 05/08/2010, 11/23/2010    Past Medical History:  Diagnosis Date   Arthritis    Asthma    Atrial fibrillation (HCC)    C. difficile diarrhea    COPD (chronic obstructive pulmonary disease) (HCC)    High cholesterol    Hyperlipemia  IBS (irritable bowel syndrome)    Lymphocytic colitis    Renal disorder    Thyroid disease     Tobacco History: Social History   Tobacco Use  Smoking Status  Former   Current packs/day: 0.00   Types: Cigarettes   Start date: 10/1961   Quit date: 09/1962   Years since quitting: 61.4  Smokeless Tobacco Never  Tobacco Comments   passive cigarette smoke exposure, smoked in high school   Counseling given: Not Answered Tobacco comments: passive cigarette smoke exposure, smoked in high school   Outpatient Medications Prior to Visit  Medication Sig Dispense Refill   acetaminophen  (TYLENOL ) 325 MG tablet Take 1-3 tablets by mouth as needed.     alendronate (FOSAMAX) 70 MG tablet Take 70 mg by mouth once a week.     Ascorbic Acid (VITAMIN C PO) Take 1 tablet by mouth daily.     B Complex-C (SUPER B COMPLEX PO) Take 1 tablet by mouth daily.     budesonide  (ENTOCORT EC ) 3 MG 24 hr capsule TAKE 3 CAPSULES (9 MG TOTAL) BY MOUTH DAILY. 270 capsule 1   Cholecalciferol (VITAMIN D3) 10 MCG (400 UNIT) tablet Take 1 tablet by mouth daily.     famotidine (PEPCID) 20 MG tablet Take 20 mg by mouth at bedtime.     fluticasone  (FLONASE ) 50 MCG/ACT nasal spray SPRAY 2 SPRAYS INTO EACH NOSTRIL EVERY DAY 48 mL 2   furosemide  (LASIX ) 20 MG tablet 20 mg daily for 2 days, then take Lasix  20 mg once weekly 30 tablet 3   hydrocortisone  (ANUSOL -HC) 2.5 % rectal cream Place 1 Application rectally 2 (two) times daily. 30 g 2   hydrocortisone  (ANUSOL -HC) 25 MG suppository Place 1 suppository (25 mg total) rectally at bedtime. 7 suppository 1   levothyroxine (SYNTHROID) 88 MCG tablet Take 88 mcg by mouth daily.     meclizine  (ANTIVERT ) 50 MG tablet Take 1 tablet (50 mg total) by mouth as needed. 30 tablet 0   Melatonin 5 MG CAPS Take 1 tablet by mouth at bedtime.     montelukast  (SINGULAIR ) 10 MG tablet TAKE 1 TABLET BY MOUTH EVERYDAY AT BEDTIME 90 tablet 1   nitroGLYCERIN  (NITROSTAT ) 0.4 MG SL tablet For chest pain, tightness, or pressure. While sitting, place 1 tablet under tongue. May be used every 5 minutes as needed, for up to 15 minutes. Do not use more than 3 tablets. 25  tablet 3   rosuvastatin  (CRESTOR ) 5 MG tablet Take 1 tablet (5 mg total) by mouth daily. 90 tablet 3   sertraline (ZOLOFT) 50 MG tablet Take 50 mg by mouth daily.      albuterol  (PROVENTIL ) (2.5 MG/3ML) 0.083% nebulizer solution Take 3 mLs (2.5 mg total) by nebulization every 6 (six) hours as needed for wheezing or shortness of breath. 75 mL 3   albuterol  (PROVENTIL ) (2.5 MG/3ML) 0.083% nebulizer solution 2.5 mg      No facility-administered medications prior to visit.     Review of Systems:   Constitutional: No weight loss or gain, night sweats, fevers, chills. +fatigue HEENT: No difficulty swallowing, tooth/dental problems, or sore throat. No sneezing, itching, ear ache. +headaches, nasal congestion, clear rhinorrhea CV:  No chest pain, orthopnea, PND, swelling in lower extremities, anasarca, dizziness, palpitations, syncope Resp: +shortness of breath with exertion; productive, dry cough; wheezing. No excess mucus or change in color of mucus. No hemoptysis.  No chest wall deformity GI:  No heartburn, indigestion, abdominal pain, nausea, vomiting, diarrhea, change in  bowel habits, loss of appetite, bloody stools.  GU: No dysuria, change in color of urine, urgency or frequency.  No flank pain, no hematuria  Skin: No rash, lesions, ulcerations MSK:  No joint pain or swelling.   Neuro: No dizziness or lightheadedness.  Psych: No depression or anxiety. Mood stable.     Physical Exam:  BP 130/76 (BP Location: Right Arm, Patient Position: Sitting, Cuff Size: Normal)   Pulse (!) 104   Temp 98 F (36.7 C) (Oral)   Ht 5\' 8"  (1.727 m)   Wt 177 lb 12.8 oz (80.6 kg)   SpO2 95%   BMI 27.03 kg/m   GEN: Pleasant, interactive, well-nourished; in no acute distress. HEENT:  Normocephalic and atraumatic. EACs patent bilaterally. TM pearly gray with present light reflex bilaterally. PERRLA. Sclera white. Nasal turbinates erythematous, moist and patent bilaterally. Clear rhinorrhea present.  Oropharynx erythematous and moist, without exudate or edema. No lesions, ulcerations, or postnasal drip.  NECK:  Supple w/ fair ROM. No JVD present. Normal carotid impulses w/o bruits. Thyroid  symmetrical with no goiter or nodules palpated. Submandibular/cervical lymphadenopathy.   CV: RRR, no m/r/g, no peripheral edema. Pulses intact, +2 bilaterally. No cyanosis, pallor or clubbing. PULMONARY:  Unlabored, regular breathing. Scattered expiratory wheezes bilaterally A&P. Bronchitic cough. No accessory muscle use. No dullness to percussion. GI: BS present and normoactive. Soft, non-tender to palpation. No organomegaly or masses detected.  MSK: No erythema, warmth or tenderness. Cap refil <2 sec all extrem. No deformities or joint swelling noted.  Neuro: A/Ox3. No focal deficits noted.   Skin: Warm, no lesions or rashe Psych: Normal affect and behavior. Judgement and thought content appropriate.     Lab Results:  CBC    Component Value Date/Time   WBC 6.6 12/22/2021 0953   RBC 4.27 12/22/2021 0953   HGB 13.5 12/22/2021 0953   HCT 40.4 12/22/2021 0953   PLT 224.0 12/22/2021 0953   MCV 94.5 12/22/2021 0953   MCHC 33.4 12/22/2021 0953   RDW 12.5 12/22/2021 0953   LYMPHSABS 1.4 12/22/2021 0953   MONOABS 0.6 12/22/2021 0953   EOSABS 0.5 12/22/2021 0953   BASOSABS 0.0 12/22/2021 0953    BMET    Component Value Date/Time   NA 139 05/17/2023 1130   NA 141 08/14/2020 1405   K 4.5 05/17/2023 1130   CL 106 05/17/2023 1130   CO2 24 05/17/2023 1130   GLUCOSE 101 (H) 05/17/2023 1130   BUN 14 05/17/2023 1130   BUN 13 08/14/2020 1405   CREATININE 1.14 (H) 05/17/2023 1130   CALCIUM  9.3 05/17/2023 1130   GFRNONAA 49 (L) 05/17/2023 1130   GFRAA 55 (L) 08/14/2020 1405    BNP    Component Value Date/Time   BNP 102.2 (H) 09/08/2022 1129     Imaging:  DG Chest 2 View Result Date: 02/13/2024 CLINICAL DATA:  Productive cough and wheezing EXAM: CHEST - 2 VIEW COMPARISON:  12/01/2021  FINDINGS: Stable cardiomediastinal silhouette. Aortic atherosclerotic calcification. Hyperinflation and chronic bronchitic change. No focal consolidation, pleural effusion, or pneumothorax. No displaced rib fractures. IMPRESSION: No acute cardiopulmonary process.  Emphysema. Electronically Signed   By: Rozell Cornet M.D.   On: 02/13/2024 19:35    ipratropium-albuterol  (DUONEB) 0.5-2.5 (3) MG/3ML nebulizer solution 3 mL     Date Action Dose Route User   02/13/2024 1505 Given 3 mL Nebulization Obike, Mercy, CMA      methylPREDNISolone  acetate (DEPO-MEDROL ) injection 80 mg     Date Action Dose Route User  02/13/2024 1608 Given 80 mg Intramuscular (Right Ventrogluteal) Obike, Mercy, CMA           No data to display          No results found for: "NITRICOXIDE"      Assessment & Plan:   Asthma Acute exacerbation of asthma with elevated exhaled nitric oxide  testing, consistent with type II inflammation. Possible secondary to URI vs allergic rhinitis. Will treat her with prednisone  taper and cough control measures. CXR to rule out acute process - hold off on antimicrobial therapy otherwise. Action plan in place. Close follow up.  Patient Instructions  Continue budesonide /formoterol  2 puffs Twice daily. Brush tongue and rinse mouth afterward Continue Albuterol  inhaler 2 puffs or 3 mL neb every 6 hours as needed for shortness of breath or wheezing. Notify if symptoms persist despite rescue inhaler/neb use. Restart flonase  nasal spray 2 sprays each nostril daily Continue cetirizine 1 tab daily for allergies Continue chlorpheniramine  over the counter as needed for cough/drainage  Continue montelukast  (singulair ) 1 tab At bedtime  Prednisone  taper. 4 tabs for 2 days, then 3 tabs for 2 days, 2 tabs for 2 days, then 1 tab for 2 days, then stop. Take in AM with food. Start tomorrow Promethazine  DM cough syrup 5 mL every 6 hours as needed for cough. Do not drive after taking. May cause  drowsiness Benzonatate  1 capsule Three times a day as needed for cough   Chest x ray today   Steroid shot today  Follow up in 2-4 weeks with Dr. Washington Hacker or Katie Dewel Lotter,NP. If symptoms do not improve or worsen, please contact office for sooner follow up or seek emergency care.    URI (upper respiratory infection) Supportive care. See above. Restart intranasal steroid     I spent 35 minutes of dedicated to the care of this patient on the date of this encounter to include pre-visit review of records, face-to-face time with the patient discussing conditions above, post visit ordering of testing, clinical documentation with the electronic health record, making appropriate referrals as documented, and communicating necessary findings to members of the patients care team.  Roetta Clarke, NP 02/16/2024  Pt aware and understands NP's role.

## 2024-02-16 ENCOUNTER — Encounter: Payer: Self-pay | Admitting: Nurse Practitioner

## 2024-02-16 DIAGNOSIS — J069 Acute upper respiratory infection, unspecified: Secondary | ICD-10-CM | POA: Insufficient documentation

## 2024-02-16 NOTE — Assessment & Plan Note (Signed)
 Supportive care. See above. Restart intranasal steroid

## 2024-02-16 NOTE — Assessment & Plan Note (Signed)
 Acute exacerbation of asthma with elevated exhaled nitric oxide  testing, consistent with type II inflammation. Possible secondary to URI vs allergic rhinitis. Will treat her with prednisone  taper and cough control measures. CXR to rule out acute process - hold off on antimicrobial therapy otherwise. Action plan in place. Close follow up.  Patient Instructions  Continue budesonide /formoterol  2 puffs Twice daily. Brush tongue and rinse mouth afterward Continue Albuterol  inhaler 2 puffs or 3 mL neb every 6 hours as needed for shortness of breath or wheezing. Notify if symptoms persist despite rescue inhaler/neb use. Restart flonase  nasal spray 2 sprays each nostril daily Continue cetirizine 1 tab daily for allergies Continue chlorpheniramine  over the counter as needed for cough/drainage  Continue montelukast  (singulair ) 1 tab At bedtime  Prednisone  taper. 4 tabs for 2 days, then 3 tabs for 2 days, 2 tabs for 2 days, then 1 tab for 2 days, then stop. Take in AM with food. Start tomorrow Promethazine  DM cough syrup 5 mL every 6 hours as needed for cough. Do not drive after taking. May cause drowsiness Benzonatate  1 capsule Three times a day as needed for cough   Chest x ray today   Steroid shot today  Follow up in 2-4 weeks with [name] [name] or [name] Lauralynn Loeb,NP. If symptoms do not improve or worsen, please contact office for sooner follow up or seek emergency care.

## 2024-02-21 ENCOUNTER — Other Ambulatory Visit: Payer: Self-pay | Admitting: Pulmonary Disease

## 2024-02-21 DIAGNOSIS — J4531 Mild persistent asthma with (acute) exacerbation: Secondary | ICD-10-CM

## 2024-02-21 NOTE — Telephone Encounter (Signed)
 Copied from CRM [id number]. Topic: Clinical - Prescription Issue >> Feb 21, 2024  4:50 PM [name] [name] wrote: Reason for CRM: Patient states she was unable to receive budesonide -formoterol  (SYMBICORT ) 80-4.5 MCG/ACT inhaler from the pharmacy - states they needed to get in touch with the provider, patient wants to confirm if she should be using this inhaler.

## 2024-02-23 NOTE — Telephone Encounter (Signed)
 I refilled at her last visit and said to continue. Not sure what the pharmacy is requesting.

## 2024-03-06 ENCOUNTER — Ambulatory Visit: Admitting: Podiatry

## 2024-03-08 ENCOUNTER — Ambulatory Visit: Admitting: Podiatry

## 2024-04-17 ENCOUNTER — Ambulatory Visit: Admitting: Podiatry

## 2024-04-17 ENCOUNTER — Encounter: Payer: Self-pay | Admitting: Podiatry

## 2024-04-17 DIAGNOSIS — G609 Hereditary and idiopathic neuropathy, unspecified: Secondary | ICD-10-CM | POA: Diagnosis not present

## 2024-04-17 MED ORDER — GABAPENTIN 100 MG PO CAPS: 100.0000 mg | ORAL_CAPSULE | Freq: Two times a day (BID) | ORAL | 3 refills | Status: DC

## 2024-04-17 NOTE — Progress Notes (Signed)
 She presents today chief concern that her toes and the lateral sides of each foot and particularly the fifth met base right are nothing feel like that she is walking on something and sometimes just gets a sharp burning pain piercing in the middle of the night in the fifth met base right.  She states that is worse at night first thing in the mornings she is concerning for neuropathy.  Objective: Vital signs stable alert oriented x 3.  Pulses are palpable.  Neurologic sensorium is slightly diminished per Semmes Weinstein monofilament to the lesser toes.  Muscle strength is normal and symmetrical bilateral.  Deep tendon reflexes are intact.  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  Plan: Started her on gabapentin 100 mg 1 p.o. twice daily follow-up with her in 1 month.  She will call with questions or concerns.

## 2024-05-01 DIAGNOSIS — N1832 Chronic kidney disease, stage 3b: Secondary | ICD-10-CM | POA: Diagnosis not present

## 2024-05-01 DIAGNOSIS — M539 Dorsopathy, unspecified: Secondary | ICD-10-CM | POA: Diagnosis not present

## 2024-05-01 DIAGNOSIS — M5136 Other intervertebral disc degeneration, lumbar region with discogenic back pain only: Secondary | ICD-10-CM | POA: Diagnosis not present

## 2024-06-05 ENCOUNTER — Encounter: Payer: Self-pay | Admitting: Podiatry

## 2024-06-05 ENCOUNTER — Ambulatory Visit: Admitting: Podiatry

## 2024-06-05 DIAGNOSIS — G609 Hereditary and idiopathic neuropathy, unspecified: Secondary | ICD-10-CM

## 2024-06-05 DIAGNOSIS — H10413 Chronic giant papillary conjunctivitis, bilateral: Secondary | ICD-10-CM | POA: Diagnosis not present

## 2024-06-05 DIAGNOSIS — H02831 Dermatochalasis of right upper eyelid: Secondary | ICD-10-CM | POA: Diagnosis not present

## 2024-06-05 NOTE — Progress Notes (Signed)
 [name] [name] presents today for follow-up of her neuropathy bilaterally.  She states that she continues to take the gabapentin just at nighttime 100 mg but not in the morning because it makes her dizzy.  So she has recently experienced pain at night around the fifth metatarsal base.  And some throbbing pain behind her right knee.  Objective: Vital signs are stable alert oriented x 3.  Pulses are palpable.  She has some tenderness on palpation of the peroneal brevis tendon as it inserts on the fifth metatarsal base with a quite prominent fifth met base.  The bone itself however is nontender.  There is some fluctuance at the tendon insertion site.  There is no erythema cellulitis drainage or odor associated with this.  On palpation of her bilateral popliteal fossa pulses are palpable and pain is not reproducible.  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  Plan: Offered her an injection she declined she is going to use topical lidocaine spray as well as Voltaren gel she will continue the use of 100 mg of gabapentin at nighttime and less she has a bad night where she will increase it to 2 or 300 mg.  I will follow-up with her in 6 months just for evaluation

## 2024-06-16 DIAGNOSIS — M47896 Other spondylosis, lumbar region: Secondary | ICD-10-CM | POA: Diagnosis not present

## 2024-06-21 DIAGNOSIS — H9313 Tinnitus, bilateral: Secondary | ICD-10-CM | POA: Diagnosis not present

## 2024-06-21 DIAGNOSIS — H903 Sensorineural hearing loss, bilateral: Secondary | ICD-10-CM | POA: Diagnosis not present

## 2024-06-21 DIAGNOSIS — H6123 Impacted cerumen, bilateral: Secondary | ICD-10-CM | POA: Diagnosis not present

## 2024-06-21 DIAGNOSIS — Z974 Presence of external hearing-aid: Secondary | ICD-10-CM | POA: Diagnosis not present

## 2024-06-26 DIAGNOSIS — L814 Other melanin hyperpigmentation: Secondary | ICD-10-CM | POA: Diagnosis not present

## 2024-06-26 DIAGNOSIS — L821 Other seborrheic keratosis: Secondary | ICD-10-CM | POA: Diagnosis not present

## 2024-06-26 DIAGNOSIS — D1801 Hemangioma of skin and subcutaneous tissue: Secondary | ICD-10-CM | POA: Diagnosis not present

## 2024-06-26 DIAGNOSIS — L578 Other skin changes due to chronic exposure to nonionizing radiation: Secondary | ICD-10-CM | POA: Diagnosis not present

## 2024-06-26 DIAGNOSIS — D229 Melanocytic nevi, unspecified: Secondary | ICD-10-CM | POA: Diagnosis not present

## 2024-06-26 DIAGNOSIS — Z85828 Personal history of other malignant neoplasm of skin: Secondary | ICD-10-CM | POA: Diagnosis not present

## 2024-07-10 ENCOUNTER — Telehealth: Payer: Self-pay | Admitting: Cardiology

## 2024-07-10 NOTE — Telephone Encounter (Signed)
 Pt c/o swelling/edema: STAT if pt has developed SOB within 24 hours  If swelling, where is the swelling located? Leg/feet  How much weight have you gained and in what time span? 6.2 in 10 Days  Have you gained 2 pounds in a day or 5 pounds in a week? yes  Do you have a log of your daily weights (if so, list)?   Are you currently taking a fluid pill? yes  Are you currently SOB? Yes but has had it for awhile  Have you traveled recently in a car or plane for an extended period of time? 10 hours on bus there and 10 hours back

## 2024-07-10 NOTE — Telephone Encounter (Signed)
 Pt reports that she recently went on a trip and her feet and legs were swollen. She states that she took a lasix  when she got home and the swelling went down. She is requesting an office visit to discuss the extra fluid and swelling she has daily. Pt is reporting a cough and SOB. Please advise.

## 2024-07-11 NOTE — Telephone Encounter (Signed)
 Spoke with pt. Advised we had not heard back for Dr Lavona yet but offered to make her a appointment. Pt has appt set for 9/25. Pt stated understanding.

## 2024-07-11 NOTE — Telephone Encounter (Signed)
 Patient is calling back for update. Please advise

## 2024-07-18 NOTE — Progress Notes (Unsigned)
 Cardiology Office Note:   Date:  07/19/2024  ID:  [name] [name], DOB 07/08/1946, MRN [id number] PCP: [name] Carlin Redbird, MD  [facility] Providers Cardiologist:  [name] Schilling, MD Cardiology APP:  [name] [name], [name] {  History of Present Illness:   [name] [name] is a 78 y.o. female  who presents for follow up of CAD  She underwent cardiac CTA reported 08/20/2020.  Her calcium  score was 74 53rd percentile for age and sex CAD was present with LAD stenosis 50 to 69% left circumflex 25 to 49% and FFR was normal in both vessels. She has severe COPD and asthma.  She had SOB.  I sent her for a coronary CTA and she had non obstructive CAD.      She did have leg swelling.   She had this significantly on a cruise.  She took diuretic for a while and this improved.  The swelling is gone away.  She has had no new cardiac problems.  She is limited by back pain but she still gets around. The patient denies any new symptoms such as chest discomfort, neck or arm discomfort. There has been no new shortness of breath, PND or orthopnea. There have been no reported palpitations, presyncope or syncope.   ROS: As stated in the HPI and negative for all other systems.  Studies Reviewed:    EKG:   EKG Interpretation Date/Time:  Thursday July 19 2024 13:32:23 EDT Ventricular Rate:  74 PR Interval:  142 QRS Duration:  80 QT Interval:  402 QTC Calculation: 446 R Axis:   1  Text Interpretation: Normal sinus rhythm Low voltage QRS When compared with ECG of 10-Jan-2024 10:13, No significant change was found Confirmed by [name] [name] ([id number]) on 07/19/2024 1:44:14 PM    Risk Assessment/Calculations:              Physical Exam:   VS:  BP 130/62   Pulse 74   Ht 5' 8.5 (1.74 m)   Wt 179 lb (81.2 kg)   SpO2 96%   BMI 26.82 kg/m    Wt Readings from Last 3 Encounters:  07/19/24 179 lb (81.2 kg)  02/13/24 177 lb 12.8 oz (80.6 kg)  01/10/24 177 lb (80.3 kg)     GEN: Well  nourished, well developed in no acute distress NECK: No JVD; No carotid bruits CARDIAC: RRR, no murmurs, rubs, gallops RESPIRATORY: Positive for scattered expiratory wheezes ABDOMEN: Soft, non-tender, non-distended EXTREMITIES:  No edema; No deformity   ASSESSMENT AND PLAN:   CAD:   She has nonobstructive coronary disease.  Since her last CT in 2023 she has had no change in her symptoms.  She is not having chest pain.  Her dyspnea is chronic.  She needs continued risk reduction.  No change in therapy.    DYSLIPIDEMIA:   She is not quite at target LDL.  Hers was 48 and I would suggest the 50s.  She will get this checked by her primary provider.  She did increase her Crestor  from 3 times a week to 4 times a week and she might consider increasing it to daily if she is not at target.    SOB:   This has been chronic.  She has some expiratory wheezing and the diagnosis of COPD.  Since this has not changed since her last study I do not think there is a cardiac etiology.  No change in therapy.  She will follow-up with her primary provider and ask him  about any bronchodilators that might be helpful.     Follow up with me as needed.  Signed, [name] Schilling, MD

## 2024-07-19 ENCOUNTER — Ambulatory Visit: Attending: Cardiology | Admitting: Cardiology

## 2024-07-19 ENCOUNTER — Encounter: Payer: Self-pay | Admitting: Cardiology

## 2024-07-19 VITALS — BP 130/62 | HR 74 | Ht 68.5 in | Wt 179.0 lb

## 2024-07-19 DIAGNOSIS — I2510 Atherosclerotic heart disease of native coronary artery without angina pectoris: Secondary | ICD-10-CM | POA: Diagnosis not present

## 2024-07-19 DIAGNOSIS — E785 Hyperlipidemia, unspecified: Secondary | ICD-10-CM | POA: Diagnosis not present

## 2024-07-19 DIAGNOSIS — R0602 Shortness of breath: Secondary | ICD-10-CM | POA: Diagnosis not present

## 2024-07-19 NOTE — Patient Instructions (Signed)
 Medication Instructions:  Continue all medications  Lab Work: None ordered  Testing/Procedures: None ordered  Follow-Up: At [facility], you and your health needs are our priority.  As part of our continuing mission to provide you with exceptional heart care, our providers are all part of one team.  This team includes your primary Cardiologist (physician) and Advanced Practice Providers or APPs (Physician Assistants and Nurse Practitioners) who all work together to provide you with the care you need, when you need it.  Your next appointment:  As Needed    Provider:  Dr.Hochrein        Target LDL Cholesterol 50   We recommend signing up for the patient portal called MyChart.  Sign up information is provided on this After Visit Summary.  MyChart is used to connect with patients for Virtual Visits (Telemedicine).  Patients are able to view lab/test results, encounter notes, upcoming appointments, etc.  Non-urgent messages can be sent to your provider as well.   To learn more about what you can do with MyChart, go to [email or web address].

## 2024-07-24 DIAGNOSIS — M5459 Other low back pain: Secondary | ICD-10-CM | POA: Diagnosis not present

## 2024-08-01 DIAGNOSIS — M47816 Spondylosis without myelopathy or radiculopathy, lumbar region: Secondary | ICD-10-CM | POA: Diagnosis not present

## 2024-08-15 DIAGNOSIS — E782 Mixed hyperlipidemia: Secondary | ICD-10-CM | POA: Diagnosis not present

## 2024-08-15 DIAGNOSIS — N183 Chronic kidney disease, stage 3 unspecified: Secondary | ICD-10-CM | POA: Diagnosis not present

## 2024-08-15 DIAGNOSIS — E039 Hypothyroidism, unspecified: Secondary | ICD-10-CM | POA: Diagnosis not present

## 2024-08-16 DIAGNOSIS — L820 Inflamed seborrheic keratosis: Secondary | ICD-10-CM | POA: Diagnosis not present

## 2024-08-20 DIAGNOSIS — E039 Hypothyroidism, unspecified: Secondary | ICD-10-CM | POA: Diagnosis not present

## 2024-08-20 DIAGNOSIS — H9319 Tinnitus, unspecified ear: Secondary | ICD-10-CM | POA: Diagnosis not present

## 2024-08-20 DIAGNOSIS — Z6827 Body mass index (BMI) 27.0-27.9, adult: Secondary | ICD-10-CM | POA: Diagnosis not present

## 2024-08-20 DIAGNOSIS — F4320 Adjustment disorder, unspecified: Secondary | ICD-10-CM | POA: Diagnosis not present

## 2024-08-20 DIAGNOSIS — Z0000 Encounter for general adult medical examination without abnormal findings: Secondary | ICD-10-CM | POA: Diagnosis not present

## 2024-08-20 DIAGNOSIS — R7301 Impaired fasting glucose: Secondary | ICD-10-CM | POA: Diagnosis not present

## 2024-08-20 DIAGNOSIS — N183 Chronic kidney disease, stage 3 unspecified: Secondary | ICD-10-CM | POA: Diagnosis not present

## 2024-08-20 DIAGNOSIS — M549 Dorsalgia, unspecified: Secondary | ICD-10-CM | POA: Diagnosis not present

## 2024-08-20 DIAGNOSIS — E782 Mixed hyperlipidemia: Secondary | ICD-10-CM | POA: Diagnosis not present

## 2024-09-08 ENCOUNTER — Other Ambulatory Visit: Payer: Self-pay | Admitting: Podiatry

## 2024-09-18 DIAGNOSIS — R300 Dysuria: Secondary | ICD-10-CM | POA: Diagnosis not present

## 2024-09-18 DIAGNOSIS — Z6827 Body mass index (BMI) 27.0-27.9, adult: Secondary | ICD-10-CM | POA: Diagnosis not present

## 2024-09-19 DIAGNOSIS — F4320 Adjustment disorder, unspecified: Secondary | ICD-10-CM | POA: Diagnosis not present

## 2024-09-19 DIAGNOSIS — Z09 Encounter for follow-up examination after completed treatment for conditions other than malignant neoplasm: Secondary | ICD-10-CM | POA: Diagnosis not present

## 2024-10-06 DIAGNOSIS — M791 Myalgia, unspecified site: Secondary | ICD-10-CM | POA: Diagnosis not present

## 2024-10-08 ENCOUNTER — Telehealth: Payer: Self-pay | Admitting: Cardiology

## 2024-10-08 NOTE — Telephone Encounter (Signed)
 Called patient back about message. Patient stated she had left jaw pain then she had a funny feelings in her chest that woke her up out of her sleep. Patient stated she rolled over on her back and symptoms got better. There were no appointments available until January 7. Informed patient that she should go to ED or urgent care for chest pain. Patient stated she is not going to do that, that she will wait for January. Patient stated she is fine right now, but concerned due to women sometimes do not have classic signs when they have a heart attack. Will forward to Dr. Lavona for further advisement.

## 2024-10-08 NOTE — Telephone Encounter (Signed)
 Pt c/o of Chest Pain: STAT if active (IN THIS MOMENT) CP, including tightness, pressure, jaw pain, shoulder/upper arm/back pain, SOB, nausea, and vomiting.  1. Are you having CP right now (tightness, pressure, or discomfort)? No  2. Are you experiencing any other symptoms (ex. SOB, nausea, vomiting, sweating)? No  3. How long have you been experiencing CP? No  4. Is your CP continuous or coming and going? N/A  5. Have you taken Nitroglycerin ? No  6. If CP returns before callback, please consider calling 911. ?

## 2024-10-30 NOTE — Progress Notes (Unsigned)
 " Cardiology Office Note   Date:  10/31/2024  ID:  [name], [name] [birth year], MRN [id number] PCP: [name] Carlin Redbird, MD  [facility] Providers Cardiologist:  Lynwood Schilling, MD Cardiology APP:  Madie Jon Garre, [name]   History of Present Illness [name] [name] is a 79 y.o. female who presents for follow-up for coronary artery disease.  Underwent cardiac CTA reported 08/20/2020.  Calcium  score was 74, 53rd percentile for age and sex CAD was present with LAD stenosis 50 to 69%, left circumflex 25 to 49% in FFR was normal in both vessels.  Had severe COPD and asthma.  Shortness of breath.  Sent for coronary CTA and had nonobstructive CAD.  She did have some leg swelling.  She had this significantly on a cruise and took a diuretic which made it improved.  Eventually swelling went away.  No new cardiac problems.  She is limited by back pain but still gets around.  Patient denied any new symptoms such as chest discomfort, neck or arm pain.  No new shortness of breath, PND, orthopnea.  There have been no reported palpitations, presyncope or syncope.  Today, she presents with a hx of shortness of breath. Her coronary disease includes a coronary calcium  score of 92 and LAD calcification on CT scans in 2021 and 2023.   She has leg swelling that worsens by evening, with her legs feeling tight and larger by late afternoon.  She is on gabapentin  200 mg nightly for neuropathy and uses topical lidocaine for breakthrough pain. Neuropathic pain has woken her from sleep several times but not in the past week.  Her asthma and COPD contribute to her shortness of breath. She denies lightheadedness, dizziness, or presyncope.  She takes rosuvastatin  5 mg daily. Her recent LDL was 86. She is attempting dietary changes to improve lipid control.  She has kidney disease with GFR 45 and avoids NSAIDs, using Tylenol  for pain to protect renal function.  She previously had a shellfish allergy , but  recent testing was negative. She has not yet reintroduced shrimp.  Reports no chest pain, pressure, or tightness. No orthopnea, PND. Reports no palpitations.   Discussed the use of AI scribe software for clinical note transcription with the patient, who gave verbal consent to proceed.  ROS: pertinent ROS in HPI  Studies Reviewed EKG Interpretation Date/Time:  Wednesday October 31 2024 14:45:29 EST Ventricular Rate:  92 PR Interval:  128 QRS Duration:  80 QT Interval:  372 QTC Calculation: 460 R Axis:   30  Text Interpretation: Normal sinus rhythm Low voltage QRS When compared with ECG of 19-Jul-2024 13:32, No significant change was found Confirmed by Lucien Blanc [phone]) on 10/31/2024 3:23:43 PM    ADDENDUM REPORT: 03/19/2022 16:52   CLINICAL DATA:  79 Year old White female   EXAM: Cardiac/Coronary  CTA   TECHNIQUE: The patient was scanned on a [employer].   FINDINGS: Scan was triggered in the descending thoracic aorta. Axial non-contrast 3 mm slices were carried out through the heart. The data set was analyzed on a dedicated work station and scored using the Agatson method. Gantry rotation speed was 250 msecs and collimation was .6 mm. 0.8 mg of sl NTG was given. The 3D data set was reconstructed in 5% intervals of the 67-82 % of the R-R cycle. Diastolic phases were analyzed on a dedicated work station using MPR, MIP and VRT modes. The patient received 100 cc of contrast.   Coronary Arteries:  Normal  coronary origin. Right dominance.   Coronary Calcium  Score:   Left main: 0   Left anterior descending artery: 79   Left circumflex artery: 13   Right coronary artery: 0   Total: 92   Percentile: 52nd for age, sex, and race matched control.   RCA is a large dominant artery that gives rise to PDA and PLA. There is no significant plaque. Stair step artifact proximal and mid RCA.   Left main is a large artery that gives rise to LAD and LCX  arteries. There is no significant plaque.   LAD is a large vessel that gives rise to one large D1 Branch. Minimal non-obstructive calcified plaque (1-24%) in proximal LAD followed by a mild non-obstructive calcified plaque (25-49%). Mild non-obstructive mixed plaque (25-49%) in mid LAD at the D1 takeoff.   LCX is a non-dominant artery that gives rise to one large OM1 branch. Mild non-obstructive calcified plaque (25-49%) in mid LAD at the OM1 takeoff. Stair step artifact mid OM1.   Other findings:   Aorta: Normal size.  Aortic atherosclerosis.  No dissection.   Main Pulmonary Artery: Normal size of the pulmonary artery.   Aortic Valve:  Tri-leaflet.  No calcifications.   Normal variant pulmonary vein drainage into the left atrium- two right middle pulmonary veins.   Normal left atrial appendage without a thrombus.   PFO noted.   Extra-cardiac findings: See attached radiology report for non-cardiac structures.   Artifact: Cardiac motion artifact   IMPRESSION: 1. Coronary calcium  score of 92. This was 52nd percentile for age, sex, and race matched control.   2. Normal coronary origin with right dominance.   3. Aortic atherosclerosis.   4. CAD-RADS 2. Mild non-obstructive CAD (25-49%). Consider non-atherosclerotic causes of chest pain. Consider preventive therapy and risk factor modification      Physical Exam VS:  BP 126/70   Pulse 92   Ht 5' 8 (1.727 m)   Wt 178 lb (80.7 kg)   SpO2 97%   BMI 27.06 kg/m        Wt Readings from Last 3 Encounters:  10/31/24 178 lb (80.7 kg)  07/19/24 179 lb (81.2 kg)  02/13/24 177 lb 12.8 oz (80.6 kg)    GEN: Well nourished, well developed in no acute distress NECK: No JVD; No carotid bruits CARDIAC: RRR, no murmurs, rubs, gallops RESPIRATORY:  Clear to auscultation without rales, wheezing or rhonchi  ABDOMEN: Soft, non-tender, non-distended EXTREMITIES:  No edema; No deformity   ASSESSMENT AND PLAN  Coronary artery  disease Moderate risk for progression with coronary calcium  score of 92. Calcium  buildup in LAD is 25-49%. Increased chest discomfort and shortness of breath suggest potential progression. - Ordered coronary CT angiography to compare with 2023 results. - Ordered BMP  Dyslipidemia LDL cholesterol is 86, close to goal of less than 70 for coronary artery disease. Discussed increasing rosuvastatin  or switching to Leqvio to achieve LDL goals without muscle weakness. - Initiated prior authorization process for Leqvio. - Provided literature on the Mediterranean diet for dietary guidance. - Continue rosuvastatin  5 mg daily until Leqvio is approved.  Chronic kidney disease stage 3 GFR is 45, indicating stage 3 chronic kidney disease. Discussed minimal impact of CT scan contrast on kidney function. - Ordered kidney function labs to monitor GFR before CT scan.  Chronic venous insufficiency Leg swelling and tightness by late afternoon, consistent with venous insufficiency. Symptoms improve with leg elevation. - Recommended wearing compression hose or socks during the day.  Dispo: She can return in 4-6 weeks to review her results.   Signed, Orren LOISE Fabry, PA-C   "

## 2024-10-31 ENCOUNTER — Ambulatory Visit: Attending: Cardiovascular Disease | Admitting: Physician Assistant

## 2024-10-31 ENCOUNTER — Encounter: Payer: Self-pay | Admitting: Physician Assistant

## 2024-10-31 VITALS — BP 126/70 | HR 92 | Ht 68.0 in | Wt 178.0 lb

## 2024-10-31 DIAGNOSIS — I2510 Atherosclerotic heart disease of native coronary artery without angina pectoris: Secondary | ICD-10-CM

## 2024-10-31 DIAGNOSIS — R600 Localized edema: Secondary | ICD-10-CM | POA: Diagnosis not present

## 2024-10-31 DIAGNOSIS — R0602 Shortness of breath: Secondary | ICD-10-CM

## 2024-10-31 DIAGNOSIS — E785 Hyperlipidemia, unspecified: Secondary | ICD-10-CM

## 2024-10-31 MED ORDER — METOPROLOL TARTRATE 100 MG PO TABS: ORAL_TABLET | ORAL | 0 refills | Status: AC

## 2024-10-31 NOTE — Patient Instructions (Addendum)
 Medication Instructions:  TAKE METOPROLOL  100MG  2 HOURS PRIOR TO TEST *If you need a refill on your cardiac medications before your next appointment, please call your pharmacy*  Lab Work: TODAY-BMET If you have labs (blood work) drawn today and your tests are completely normal, you will receive your results only by: MyChart Message (if you have MyChart) OR A paper copy in the mail If you have any lab test that is abnormal or we need to change your treatment, we will call you to review the results.  Testing/Procedures: Coronary CTA Instructions Below  Follow-Up: At [facility], you and your health needs are our priority.  As part of our continuing mission to provide you with exceptional heart care, our providers are all part of one team.  This team includes your primary Cardiologist (physician) and Advanced Practice Providers or APPs (Physician Assistants and Nurse Practitioners) who all work together to provide you with the care you need, when you need it.  Your next appointment:   4-6 week(s)  Provider:   Lynwood Schilling, MD or Orren Fabry, PA    We recommend signing up for the patient portal called MyChart.  Sign up information is provided on this After Visit Summary.  MyChart is used to connect with patients for Virtual Visits (Telemedicine).  Patients are able to view lab/test results, encounter notes, upcoming appointments, etc.  Non-urgent messages can be sent to your provider as well.   To learn more about what you can do with MyChart, go to [email or web address].   Other Instructions    Your cardiac CT will be scheduled at one of the below locations:   [facility] [address]  [place], [place] [address]   If scheduled at the [facility] at [address], please enter the parking lot using the [address] entrance and use the FREE valet service at the patient drop-off area. Enter the building and check-in with  registration on the main floor.  Please follow these instructions carefully (unless otherwise directed):  An IV will be required for this test and Nitroglycerin  will be given.  Hold all erectile dysfunction medications at least 3 days (72 hrs) prior to test. (Ie viagra, cialis, sildenafil, tadalafil, etc)   On the Night Before the Test: Be sure to Drink plenty of water. Do not consume any caffeinated/decaffeinated beverages or chocolate 12 hours prior to your test. Do not take any antihistamines 12 hours prior to your test.  On the Day of the Test: Drink plenty of water until 1 hour prior to the test. Do not eat any food 1 hour prior to test. You may take your regular medications prior to the test.  Take metoprolol  (Lopressor ) two hours prior to test. If you take Furosemide /Hydrochlorothiazide/Spironolactone/Chlorthalidone, please HOLD on the morning of the test. Patients who wear a continuous glucose monitor MUST remove the device prior to scanning. FEMALES- please wear underwire-free bra if available, avoid dresses & tight clothing      After the Test: Drink plenty of water. After receiving IV contrast, you may experience a mild flushed feeling. This is normal. On occasion, you may experience a mild rash up to 24 hours after the test. This is not dangerous. If this occurs, you can take Benadryl 25 mg, Zyrtec, Claritin, or Allegra and increase your fluid intake. (Patients taking Tikosyn should avoid Benadryl, and may take Zyrtec, Claritin, or Allegra) If you experience trouble breathing, this can be serious. If it is severe  call 911 IMMEDIATELY. If it is mild, please call our office.  We will call to schedule your test 2-4 weeks out understanding that some insurance companies will need an authorization prior to the service being performed.   For more information and frequently asked questions, please visit our website : [email or web address]  For non-scheduling related  questions, please contact the cardiac imaging nurse navigator should you have any questions/concerns: Cardiac Imaging Nurse Navigators Direct Office Dial: [phone]   For scheduling needs, including cancellations and rescheduling, please call Brittany, [phone].

## 2024-11-01 ENCOUNTER — Ambulatory Visit: Admitting: Podiatry

## 2024-11-01 LAB — BASIC METABOLIC PANEL WITH GFR
BUN/Creatinine Ratio: 8 — ABNORMAL LOW (ref 12–28)
BUN: 10 mg/dL (ref 8–27)
CO2: 25 mmol/L (ref 20–29)
Calcium: 9.6 mg/dL (ref 8.7–10.3)
Chloride: 103 mmol/L (ref 96–106)
Creatinine, Ser: 1.21 mg/dL — ABNORMAL HIGH (ref 0.57–1.00)
Glucose: 95 mg/dL (ref 70–99)
Potassium: 4.1 mmol/L (ref 3.5–5.2)
Sodium: 141 mmol/L (ref 134–144)
eGFR: 45 mL/min/1.73 — ABNORMAL LOW

## 2024-11-02 ENCOUNTER — Ambulatory Visit: Payer: Self-pay | Admitting: Physician Assistant

## 2024-11-05 ENCOUNTER — Ambulatory Visit (HOSPITAL_COMMUNITY)
Admission: RE | Admit: 2024-11-05 | Discharge: 2024-11-05 | Attending: Physician Assistant | Admitting: Physician Assistant

## 2024-11-05 DIAGNOSIS — E785 Hyperlipidemia, unspecified: Secondary | ICD-10-CM | POA: Diagnosis not present

## 2024-11-05 DIAGNOSIS — R0602 Shortness of breath: Secondary | ICD-10-CM | POA: Insufficient documentation

## 2024-11-05 DIAGNOSIS — I2510 Atherosclerotic heart disease of native coronary artery without angina pectoris: Secondary | ICD-10-CM | POA: Diagnosis not present

## 2024-11-05 MED ORDER — IOHEXOL 350 MG/ML SOLN
100.0000 mL | Freq: Once | INTRAVENOUS | Status: AC | PRN
  Administered 2024-11-05: 100 mL via INTRAVENOUS

## 2024-11-05 MED ORDER — NITROGLYCERIN 0.4 MG SL SUBL
0.8000 mg | SUBLINGUAL_TABLET | Freq: Once | SUBLINGUAL | Status: AC
  Administered 2024-11-05: 0.8 mg via SUBLINGUAL

## 2024-11-06 ENCOUNTER — Telehealth: Payer: Self-pay | Admitting: Physician Assistant

## 2024-11-06 ENCOUNTER — Ambulatory Visit: Admitting: Podiatry

## 2024-11-06 ENCOUNTER — Telehealth: Payer: Self-pay | Admitting: Pharmacy Technician

## 2024-11-06 ENCOUNTER — Encounter: Payer: Self-pay | Admitting: Physician Assistant

## 2024-11-06 ENCOUNTER — Telehealth: Payer: Self-pay | Admitting: Pharmacist

## 2024-11-06 DIAGNOSIS — G5781 Other specified mononeuropathies of right lower limb: Secondary | ICD-10-CM | POA: Diagnosis not present

## 2024-11-06 MED ORDER — TRIAMCINOLONE ACETONIDE 40 MG/ML IJ SUSP
20.0000 mg | Freq: Once | INTRAMUSCULAR | Status: AC
  Administered 2024-11-06: 20 mg

## 2024-11-06 NOTE — Progress Notes (Signed)
 She presents today complaining of pain to the 3rd, 4th and 5th digits of the right foot states that it feels like it is on fire she has been taking her gabapentin  on a regular basis which does sometimes give her dizziness but she has noticed that she is having more pain in the forefoot.  Objective: Vital signs are stable and oriented x 3.  She has pain on palpation to the third interdigital space of the right foot with radiating pain to the toes.  Assessment: Neuroma third interdigital space right foot.  Plan: Injected third interdigital space today 10 mg Kenalog  5 mg Marcaine for maximal tenderness.  I will follow-up with her in the near future.

## 2024-11-06 NOTE — Telephone Encounter (Signed)
 Auth Submission: NO AUTH NEEDED Site of care: Site of care: [facility] Payer: [employer] ADVT Medication & CPT/J Code(s) submitted: Leqvio (Inclisiran) J1306 Diagnosis Code:  Route of submission (phone, fax, portal):  Phone # Fax # Auth type: Buy/Bill PB Units/visits requested: 284MG  Q3 MONTHS X2 DOSES THEN Q6 MONTHS Reference number:  Approval from: 11/06/24 to 10/24/25

## 2024-11-06 NOTE — Telephone Encounter (Signed)
Placing orders for Leqvio 

## 2024-11-06 NOTE — Telephone Encounter (Signed)
 Printed and completed the Leqvio service start form. The patient will come in today after 3pm to sign the form. Patient told to come to the fifth floor and ask for [facility]. Patient voiced understanding.

## 2024-11-06 NOTE — Telephone Encounter (Signed)
 Patient is requesting a call from Orren Fabry to discuss injections that were discussed during patients 10/31/24 appt

## 2024-11-06 NOTE — Progress Notes (Signed)
 Treatment plan entered by Chris Pavero, Pharm. Leqvio consent form in process per CMA

## 2024-11-06 NOTE — Telephone Encounter (Signed)
 Pt taken care of by Ula Hummer, CMA. Closing encounter

## 2024-11-08 NOTE — Telephone Encounter (Signed)
 Patient has been approved for [employer] grant:  ID #[id number] Enrollment period: 10/09/24-10/08/25 Lorrene amount $2500.00

## 2024-11-15 ENCOUNTER — Telehealth: Payer: Self-pay | Admitting: Physician Assistant

## 2024-11-15 NOTE — Telephone Encounter (Signed)
 [name] [name] called in stating they received Leqvio Start Form however it is missing some information. Please advise if this can be faxed and refaxed. He states you may call him if any questions. He states he usually speaks with Melissa, RPH-CPP.   Section 5: prescriber information - Practice NPI is incorrect, should be [id number]   Section 8: needs to be completed with infusion center information  [facility]  NPI is the same [id number]  Address [address]  [place], [place] [address]  Phone: [phone]

## 2024-11-19 ENCOUNTER — Telehealth: Payer: Self-pay | Admitting: Pharmacy Technician

## 2024-11-19 ENCOUNTER — Encounter: Payer: Self-pay | Admitting: Physician Assistant

## 2024-11-19 ENCOUNTER — Other Ambulatory Visit (HOSPITAL_COMMUNITY): Payer: Self-pay

## 2024-11-19 MED ORDER — REPATHA SURECLICK 140 MG/ML ~~LOC~~ SOAJ
1.0000 mL | SUBCUTANEOUS | 11 refills | Status: DC
  Filled 2024-11-19: qty 2, 28d supply, fill #0

## 2024-11-19 NOTE — Telephone Encounter (Signed)
 Pharmacy Patient Advocate Encounter  Received notification from [facility] ADVANTAGE/RX ADVANCE that Prior Authorization for repatha  has been APPROVED from 11/19/24 to 05/18/25   PA #/Case ID/Reference #: [id number]      [name] [name] now passed and in [place]

## 2024-11-19 NOTE — Addendum Note (Signed)
 Addended by: Dusten Ellinwood D on: 11/19/2024 06:41 PM   Modules accepted: Orders

## 2024-11-19 NOTE — Telephone Encounter (Signed)
" ° ° ° ° °  Her healthwell grant is  still under review  Pharmacy Patient Advocate Encounter   Received notification from Pt Calls Messages that prior authorization for repatha  is required/requested.   Insurance verification completed.   The patient is insured through [facility] ADVANTAGE/RX ADVANCE.   Per test claim: PA required; PA submitted to above mentioned insurance via Latent Key/confirmation #/EOC [id number] Status is pending    "

## 2024-11-19 NOTE — Telephone Encounter (Signed)
 I called and spoke with patient and advised that Leqvio isnt going to be very economical for her. She had sent a message that I had not seen that she wanted to go with Repatha . [employer] grant has already been obtained. Will submit PA and get Rx and grant info to [facility] so she can pick up when she see Dr. Lavona on 1/29.

## 2024-11-20 ENCOUNTER — Other Ambulatory Visit (HOSPITAL_COMMUNITY): Payer: Self-pay

## 2024-11-20 NOTE — Progress Notes (Unsigned)
" °  Cardiology Office Note:   Date:  11/22/2024  ID:  [name] [name], DOB 12-15-1945, MRN [id number] PCP: Aisha Harvey, MD  [facility] Providers Cardiologist:  Lynwood Schilling, MD Cardiology APP:  Madie Jon Garre, [name] {  History of Present Illness:   [name] [name] is a 79 y.o. female who presents for follow up of CAD  She underwent cardiac CTA reported 08/20/2020.  Her calcium  score was 74 53rd percentile for age and sex CAD was present with LAD stenosis 50 to 69% left circumflex 25 to 49% and FFR was normal in both vessels. She has severe COPD and asthma.  She had SOB.  I sent her for a coronary CTA and she had non obstructive CAD in Jan 2026.      She presents for follow up.  She continues to have dyspnea with activity.  She reports this to be with activity such as walking moderate distance on level ground.  She is not describing resting shortness of breath, PND or orthopnea.  She has not had any new palpitations, presyncope or syncope.  She has had no new chest pressure, neck or arm discomfort.  She has had no weight gain.  Lower extremity edema is less than it was when she was on a cruise recently.  ROS: As stated in the HPI and negative for all other systems.  Studies Reviewed:    EKG:     NA  Risk Assessment/Calculations:              Physical Exam:   VS:  BP 120/60 (BP Location: Left Arm, Patient Position: Sitting, Cuff Size: Normal)   Pulse 83   Ht 5' 8 (1.727 m)   Wt 170 lb 9.6 oz (77.4 kg)   SpO2 99%   BMI 25.94 kg/m    Wt Readings from Last 3 Encounters:  11/22/24 170 lb 9.6 oz (77.4 kg)  10/31/24 178 lb (80.7 kg)  07/19/24 179 lb (81.2 kg)     GEN: Well nourished, well developed in no acute distress NECK: No JVD; No carotid bruits CARDIAC: RRR, no murmurs, rubs, gallops RESPIRATORY:  Clear to auscultation without rales, wheezing or rhonchi  ABDOMEN: Soft, non-tender, non-distended EXTREMITIES:  No edema; No deformity   ASSESSMENT AND PLAN:    CAD:   The patient has no new sypmtoms.  No further cardiovascular testing is indicated.  We will continue with aggressive risk reduction and meds as listed.  She had nonobstructive disease essentially unchanged from a few years ago.  DYSLIPIDEMIA:   She does agree to try a little higher dose of Crestor .  I had written a prescription for Repatha  because she was not quite at target but she does not want to start this.  Her LDL was 86.  We are going to try Crestor  10 mg daily and then if she does get increased muscle aches we might have to switch to PCSK9 versus bempedoic acid.    SOB:    I do not see a new cardiac etiology to this.   I have suggested she go back to see pulmonary.    Follow up with APP in 6 months.  Signed, Lynwood Schilling, MD   "

## 2024-11-22 ENCOUNTER — Encounter: Payer: Self-pay | Admitting: Cardiology

## 2024-11-22 ENCOUNTER — Ambulatory Visit: Attending: Cardiology | Admitting: Cardiology

## 2024-11-22 ENCOUNTER — Other Ambulatory Visit (HOSPITAL_COMMUNITY): Payer: Self-pay

## 2024-11-22 VITALS — BP 120/60 | HR 83 | Ht 68.0 in | Wt 170.6 lb

## 2024-11-22 DIAGNOSIS — E785 Hyperlipidemia, unspecified: Secondary | ICD-10-CM

## 2024-11-22 DIAGNOSIS — R0602 Shortness of breath: Secondary | ICD-10-CM | POA: Diagnosis not present

## 2024-11-22 DIAGNOSIS — I2510 Atherosclerotic heart disease of native coronary artery without angina pectoris: Secondary | ICD-10-CM | POA: Diagnosis not present

## 2024-11-22 MED ORDER — ROSUVASTATIN CALCIUM 10 MG PO TABS: 10.0000 mg | ORAL_TABLET | Freq: Every day | ORAL | 3 refills | Status: AC

## 2024-11-22 NOTE — Patient Instructions (Signed)
 Medication Instructions:  Increase Crestor  to 10 mg once daily *If you need a refill on your cardiac medications before your next appointment, please call your pharmacy*  Lab Work: Fasting lipid panel in 3 months at [facility] If you have labs (blood work) drawn today and your tests are completely normal, you will receive your results only by: MyChart Message (if you have MyChart) OR A paper copy in the mail If you have any lab test that is abnormal or we need to change your treatment, we will call you to review the results.  Testing/Procedures: NONE  Follow-Up: At [facility], you and your health needs are our priority.  As part of our continuing mission to provide you with exceptional heart care, our providers are all part of one team.  This team includes your primary Cardiologist (physician) and Advanced Practice Providers or APPs (Physician Assistants and Nurse Practitioners) who all work together to provide you with the care you need, when you need it.  Your next appointment:   6 months  Provider:   One of our Advanced Practice Providers (APPs): Morse Clause, PA-C  Hanh Waddell Daniels, PA-C  Saddie Cleaves, NP  Olivia Pavy, PA-C Miriam Shams, NP  Leontine Salen, PA-C Josefa Beauvais, NP  [facility], PA-C [place], PA-C  [place], PA-C [place], [place]  Damien Braver, NP Jon Hails, PA-C  Waddell Donath, PA-C Dayna Dunn, PA-C  [place], PA-C [place], [place] Glendia Ferrier, PA-C Callie Goodrich, PA-C  Katlyn West, NP Thom Sluder, PA-C  Alyssa White, NP Rollo Louder, PA-C Xika Zhao, NP    Lamarr Satterfield, NP    We recommend signing up for the patient portal called MyChart.  Sign up information is provided on this After Visit Summary.  MyChart is used to connect with patients for Virtual Visits (Telemedicine).  Patients are able to view lab/test results, encounter notes, upcoming appointments, etc.  Non-urgent messages can be sent to your provider as  well.   To learn more about what you can do with MyChart, go to [email or web address].   Other Instructions

## 2024-11-29 ENCOUNTER — Encounter: Payer: Self-pay | Admitting: Physician Assistant

## 2024-12-04 ENCOUNTER — Ambulatory Visit: Admitting: Cardiology

## 2024-12-04 ENCOUNTER — Ambulatory Visit: Admitting: Podiatry
